# Patient Record
Sex: Female | Born: 2005 | Race: White | Hispanic: No | Smoking: Never smoker
Health system: Southern US, Community
[De-identification: ages and names within clinical notes are randomized; demographics above are authoritative.]

## PROBLEM LIST (undated history)

## (undated) DIAGNOSIS — R1110 Vomiting, unspecified: Secondary | ICD-10-CM

## (undated) DIAGNOSIS — Z9889 Other specified postprocedural states: Secondary | ICD-10-CM

## (undated) DIAGNOSIS — H539 Unspecified visual disturbance: Secondary | ICD-10-CM

## (undated) DIAGNOSIS — J45909 Unspecified asthma, uncomplicated: Secondary | ICD-10-CM

## (undated) DIAGNOSIS — F849 Pervasive developmental disorder, unspecified: Secondary | ICD-10-CM

## (undated) DIAGNOSIS — F909 Attention-deficit hyperactivity disorder, unspecified type: Secondary | ICD-10-CM

## (undated) DIAGNOSIS — R109 Unspecified abdominal pain: Secondary | ICD-10-CM

## (undated) DIAGNOSIS — R112 Nausea with vomiting, unspecified: Secondary | ICD-10-CM

## (undated) DIAGNOSIS — G90A Postural orthostatic tachycardia syndrome (POTS): Secondary | ICD-10-CM

## (undated) DIAGNOSIS — G901 Familial dysautonomia [Riley-Day]: Secondary | ICD-10-CM

## (undated) HISTORY — DX: Pervasive developmental disorder, unspecified: F84.9

## (undated) HISTORY — DX: Unspecified asthma, uncomplicated: J45.909

## (undated) HISTORY — DX: Unspecified visual disturbance: H53.9

## (undated) HISTORY — DX: Vomiting, unspecified: R11.10

## (undated) HISTORY — DX: Attention-deficit hyperactivity disorder, unspecified type: F90.9

## (undated) HISTORY — DX: Unspecified abdominal pain: R10.9

---
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
## 2023-01-03 ENCOUNTER — Ambulatory Visit (INDEPENDENT_AMBULATORY_CARE_PROVIDER_SITE_OTHER): Payer: Medicaid Other | Admitting: Pediatric Endocrinology

## 2023-01-03 ENCOUNTER — Encounter (INDEPENDENT_AMBULATORY_CARE_PROVIDER_SITE_OTHER): Payer: Self-pay | Admitting: Pediatric Endocrinology

## 2023-01-03 ENCOUNTER — Ambulatory Visit (INDEPENDENT_AMBULATORY_CARE_PROVIDER_SITE_OTHER): Payer: Medicaid Other | Admitting: Licensed Clinical Social Worker

## 2023-01-03 VITALS — BP 118/70 | HR 98 | Ht 64.41 in | Wt 167.6 lb

## 2023-01-03 DIAGNOSIS — F32A Depression, unspecified: Secondary | ICD-10-CM

## 2023-01-03 DIAGNOSIS — E349 Endocrine disorder, unspecified: Secondary | ICD-10-CM

## 2023-01-03 DIAGNOSIS — F419 Anxiety disorder, unspecified: Secondary | ICD-10-CM | POA: Diagnosis not present

## 2023-01-03 DIAGNOSIS — F649 Gender identity disorder, unspecified: Secondary | ICD-10-CM

## 2023-01-03 NOTE — BH Specialist Note (Signed)
Integrated Behavioral Health Warm Hand Off   MRN: [id number] Name: Daejah Klebba  Number of [place] Clinician visits: 0/6  Types of Service: Introduction only  Interpretor:No.    Warm Hand Off Completed.     Subjective: Samira Acero is a 17 y.o. adult accompanied by Self.  Patient was referred by Dr. Baldo Ash for gender related therapeutic services.  Patient reports the following symptoms/concerns: Patient reports a question about Gender Dysphoria diagnosis he reports is in his MyChart. Patient expresses the desire to have a letter for top surgery reporting the surgery will help improve his quality of life. Patient reports having other things going on that he would like to talk about further in therapy.    Objective: Mood: Euthymic and Affect: Appropriate  Clinician introduced herself and explained structure of [place], discussing number of sessions and billing. Patient expresses desire for therapeutic services. Scheduled initial visit for 01/13/23 at 3:30 pm    Plan: Follow up with behavioral health clinician on: 01/13/23 at 3:30 pm Referral(s): [place] (In Clinic)   Valda Favia, [place]

## 2023-01-03 NOTE — Progress Notes (Signed)
Pediatric Endocrinology Consultation Follow up Visit  Colleen Lopez 03/10/2006 [id number]  Preferred Name: Colleen Lopez Preferred Pronouns: he/him  HPI: Colleen Lopez is a 17 y.o. assigned female at birth presenting for follow up of gender affirming care. 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 He established care 03/23/2021, and received his first Lupron depot 30mg  injection 04/21/2021, and skipped August 2022 dose. He is accompanied to this visit by his grandmother who is his legal guardian. His grandfather lives in a separate household, and shares medical decision making though he does not attend medical appointments. There are multiple family members who do not know about Colleen Lopez.  2. Colleen Lopez was last seen in pediatric endocrinology on 09/06/22. In the interim he has been doing ok. He has had issues with his mom since he started transitioning with [facility].  He started testosterone in September 2023 after turning 30 years of age.   His mom is refusing to see him for who he is. She has been undermining his transition and his access to medical care. He is making plans to move to [place] to live with his GF. He says that her family is very accepting. His brothers are also accepting but they have not been wanting to take a stand with mom and create a rift in the family.   He has a lot of questions about timing of his top surgery and if Medicaid will continue to cover the surgery.   He also has questions about integrated behavioral health and questions about accutane.   He is taking 40 mg of testosterone every week. No issues with injection.     Body Goals: unchanged -flatter chest -deeper voice -more masculine body structure -more facial hair and other masculine hair growth  3. ROS: Greater than 10 systems reviewed with pertinent positives listed in HPI, otherwise neg.  Past Medical History:   Past Medical History:  Diagnosis Date   Abdominal pain    ADHD (attention deficit  hyperactivity disorder)    Asthma    Pervasive developmental disorder    Vision abnormalities    Vomiting   Initial history: When he was 27-58 years old he felt that he was "dysphoric" and fought with his gender identity.  He has always preferred masculine things.  He hated being called anything feminine or being forced to present as female.  He knows that he is not a tomboy. In 5-6th grade he discovered the term "transgender" and feels that best represents him.  Before high school and during the pandemic he "came out."  He socially transitioned 2 years ago.  He cut his hair and school system changed his name at age 61. He is binding sometimes.    He is seeing a psychiatrist, Dr. Diannia Ruder. This provider was reportedly uncomfortable with making the diagnosis of gender dysphoria.  He is also seeing a dermatologist for acne.  Meds: Outpatient Encounter Medications as of 01/03/2023  Medication Sig   albuterol (PROAIR HFA) 108 (90 Base) MCG/ACT inhaler Inhale 2 puffs into the lungs every 4 (four) hours as needed for wheezing or shortness of breath.   Azelastine HCl 0.15 % SOLN Place 2 sprays into both nostrils 2 (two) times daily.   fluticasone (FLONASE) 50 MCG/ACT nasal spray Place 2 sprays into both nostrils daily.   fluticasone (FLOVENT HFA) 110 MCG/ACT inhaler Inhale 2 puffs into the lungs 2 (two) times daily.   Insulin Syringe-Needle U-100 (INSULIN SYRINGE .5CC/28G) 28G X 1/2" 0.5 ML MISC Use as directed with  testosterone   Leuprolide Acetate, Ped,,6Mon, (FENSOLVI, 6 MONTH,) 45 MG KIT Inject 45 mg into the skin every 6 (six) months for 1 dose. [phone] (legal guardian's number)   Spacer/Aero-Holding Deretha Emory DEVI 1 applicator by Does not apply route as needed.   testosterone cypionate (DEPOTESTOSTERONE CYPIONATE) 200 MG/ML injection Inject 0.2 mLs (40 mg total) into the skin every 7 (seven) days.   Adapalene-Benzoyl Peroxide 0.3-2.5 % GEL Apply topically. (Patient not taking: Reported on  01/03/2023)   escitalopram (LEXAPRO) 20 MG tablet Take 1 tablet (20 mg total) by mouth daily. (Patient not taking: Reported on 01/03/2023)   hydrOXYzine (VISTARIL) 50 MG capsule Take 1 capsule (50 mg total) by mouth at bedtime. (Patient not taking: Reported on 01/03/2023)   Leuprolide Acetate, 3 Month, (LUPRON DEPOT-PED, 35-MONTH, IM) Inject into the muscle. (Patient not taking: Reported on 01/03/2023)   lidocaine-prilocaine (EMLA) cream Use as directed (Patient not taking: Reported on 01/03/2023)   methylphenidate (RITALIN) 10 MG tablet Take 1 tablet (10 mg total) by mouth 2 (two) times daily with breakfast and lunch. (Patient not taking: Reported on 01/03/2023)   mupirocin ointment (BACTROBAN) 2 % Apply 1 Application topically 2 (two) times daily. (Patient not taking: Reported on 01/03/2023)   norethindrone (MICRONOR) 0.35 MG tablet Take 1 tablet (0.35 mg total) by mouth daily. (Patient not taking: Reported on 01/03/2023)   Vitamin D, Ergocalciferol, (DRISDOL) 1.25 MG (50000 UNIT) CAPS capsule Take 1 capsule (50,000 Units total) by mouth every 7 (seven) days. (Patient not taking: Reported on 01/03/2023)   [DISCONTINUED] Leuprolide Acetate, Ped,,6Mon, (FENSOLVI, 6 MONTH,) 45 MG KIT Inject 45 mg into the skin every 6 (six) months for 1 dose.   [DISCONTINUED] Leuprolide Acetate, Ped,,6Mon, (FENSOLVI, 6 MONTH,) 45 MG KIT Inject 45 mg into the skin every 6 (six) months for 1 dose.   No facility-administered encounter medications on file as of 01/03/2023.    Allergies: No Known Allergies  Surgical History: Past Surgical History:  Procedure Laterality Date   septorhinoplasty  07/2020     Family History:  Family History  Problem Relation Age of Onset   Obesity Maternal Grandmother    Diabetes type II Maternal Grandmother    Hypertension Maternal Grandmother    COPD Maternal Grandmother    Hyperlipidemia Maternal Grandmother    Heart disease Maternal Grandmother    Aneurysm Maternal Grandfather     Early death Maternal Grandfather    Bipolar disorder Mother    Drug abuse Mother    Drug abuse Father    Alcohol abuse Father    ADD / ADHD Brother    Bipolar disorder Paternal Uncle    Migraines Neg Hx     Social History: Social History   Social History Narrative   ** Merged History Encounter **       Lives at home with grandmother and step grandfather, aunt and two half brothers. 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      Lives with [name] and 2 half brothers.    He is going to work towards getting GED   He enjoys playing bass, (Psychiatric nurse - guitar)  drawing, and fantasy world building.       Getting his GED. 23-24 school year  In a relationship with a trans-female. Non- penetrative intercourse  Physical Exam:  Vitals:   01/03/23 1001  BP: 118/70  Pulse: 98  Weight: 167 lb 9.6 oz (76 kg)  Height:  5' 4.41" (1.636 m)   BP 118/70 (BP Location: Left Arm, Patient Position: Sitting, Cuff Size: Large)   Pulse 98   Ht 5' 4.41" (1.636 m)   Wt 167 lb 9.6 oz (76 kg)   LMP 12/02/2022 (Approximate)   BMI 28.40 kg/m  Body mass index: body mass index is 28.4 kg/m. Blood pressure %iles are not available for patients who are 18 years or older.  Wt Readings from Last 3 Encounters:  01/03/23 167 lb 9.6 oz (76 kg) (92 %, Z= 1.42)*  12/22/22 166 lb 1.9 oz (75.4 kg) (92 %, Z= 1.39)*  11/08/22 172 lb 1.3 oz (78.1 kg) (94 %, Z= 1.52)*   * Growth percentiles are based on CDC (Girls, 2-20 Years) data.   Ht Readings from Last 3 Encounters:  01/03/23 5' 4.41" (1.636 m) (53 %, Z= 0.07)*  12/22/22 5\' 4"  (1.626 m) (46 %, Z= -0.09)*  11/08/22 5\' 5"  (1.651 m) (62 %, Z= 0.30)*   * Growth percentiles are based on CDC (Girls, 2-20 Years) data.   +9 pounds since last visit  Physical Exam Vitals reviewed.  Constitutional:      Appearance: Normal appearance.  HENT:     Head: Normocephalic and atraumatic.     Nose: Nose  normal.     Mouth/Throat:     Mouth: Mucous membranes are moist.  Eyes:     Extraocular Movements: Extraocular movements intact.     Comments: glasses  Cardiovascular:     Pulses: Normal pulses.  Pulmonary:     Effort: Pulmonary effort is normal. No respiratory distress.  Abdominal:     General: There is no distension.  Musculoskeletal:        General: Normal range of motion.     Cervical back: Normal range of motion and neck supple.  Skin:    General: Skin is warm.     Capillary Refill: Capillary refill takes less than 2 seconds.     Findings: No rash.  Neurological:     General: No focal deficit present.     Mental Status: He is alert.     Gait: Gait normal.  Psychiatric:        Mood and Affect: Mood normal.        Behavior: Behavior normal.      Labs: Results for orders placed or performed in visit on 12/22/22  Urine Culture   Specimen: Urine   UR  Result Value Ref Range   Urine Culture, Routine Final report    Organism ID, Bacteria Comment   Hemoglobin A1c  Result Value Ref Range   Hgb A1c MFr Bld 5.4 4.8 - 5.6 %   Est. average glucose Bld gHb Est-mCnc 108 mg/dL  VITAMIN D 25 Hydroxy (Vit-D Deficiency, Fractures)  Result Value Ref Range   Vit D, 25-Hydroxy 13.4 (L) 30.0 - 100.0 ng/mL  TSH + free T4  Result Value Ref Range   TSH 1.340 0.450 - 4.500 uIU/mL   Free T4 1.47 0.93 - 1.60 ng/dL  Lipid panel  Result Value Ref Range   Cholesterol, Total 163 100 - 169 mg/dL   Triglycerides 914 (H) 0 - 89 mg/dL   HDL 30 (L) >78 mg/dL   VLDL Cholesterol Cal 23 5 - 40 mg/dL   LDL Chol Calc (NIH) 295 (H) 0 - 109 mg/dL   Chol/HDL Ratio 5.4 (H) 0.0 - 4.4 ratio  CMP14+EGFR  Result Value Ref Range   Glucose 90 70 - 99 mg/dL   BUN  9 6 - 20 mg/dL   Creatinine, Ser 4.09 0.57 - 1.00 mg/dL   eGFR 94 >81 XB/JYN/8.29   BUN/Creatinine Ratio 10 9 - 23   Sodium 139 134 - 144 mmol/L   Potassium 4.4 3.5 - 5.2 mmol/L   Chloride 103 96 - 106 mmol/L   CO2 21 20 - 29 mmol/L    Calcium 10.0 8.7 - 10.2 mg/dL   Total Protein 7.5 6.0 - 8.5 g/dL   Albumin 4.9 4.0 - 5.0 g/dL   Globulin, Total 2.6 1.5 - 4.5 g/dL   Albumin/Globulin Ratio 1.9 1.2 - 2.2   Bilirubin Total 0.5 0.0 - 1.2 mg/dL   Alkaline Phosphatase 102 42 - 106 IU/L   AST 18 0 - 40 IU/L   ALT 19 0 - 32 IU/L  CBC with Differential/Platelet  Result Value Ref Range   WBC 6.5 3.4 - 10.8 x10E3/uL   RBC 5.27 3.77 - 5.28 x10E6/uL   Hemoglobin 15.3 11.1 - 15.9 g/dL   Hematocrit 56.2 13.0 - 46.6 %   MCV 86 79 - 97 fL   MCH 29.0 26.6 - 33.0 pg   MCHC 33.7 31.5 - 35.7 g/dL   RDW 86.5 78.4 - 69.6 %   Platelets 356 150 - 450 x10E3/uL   Neutrophils 52 Not Estab. %   Lymphs 40 Not Estab. %   Monocytes 6 Not Estab. %   Eos 1 Not Estab. %   Basos 1 Not Estab. %   Neutrophils Absolute 3.4 1.4 - 7.0 x10E3/uL   Lymphocytes Absolute 2.6 0.7 - 3.1 x10E3/uL   Monocytes Absolute 0.4 0.1 - 0.9 x10E3/uL   EOS (ABSOLUTE) 0.1 0.0 - 0.4 x10E3/uL   Basophils Absolute 0.1 0.0 - 0.2 x10E3/uL   Immature Granulocytes 0 Not Estab. %   Immature Grans (Abs) 0.0 0.0 - 0.1 x10E3/uL  Urinalysis, Routine w reflex microscopic  Result Value Ref Range   Specific Gravity, UA 1.023 1.005 - 1.030   pH, UA 5.5 5.0 - 7.5   Color, UA Yellow Yellow   Appearance Ur Clear Clear   Leukocytes,UA Negative Negative   Protein,UA Trace Negative/Trace   Glucose, UA Negative Negative   Ketones, UA Negative Negative   RBC, UA Negative Negative   Bilirubin, UA Negative Negative   Urobilinogen, Ur 0.2 0.2 - 1.0 mg/dL   Nitrite, UA Negative Negative   Microscopic Examination Comment   POCT URINALYSIS DIP (CLINITEK)  Result Value Ref Range   Color, UA yellow yellow   Clarity, UA clear clear   Glucose, UA negative negative mg/dL   Bilirubin, UA negative negative   Ketones, POC UA negative negative mg/dL   Spec Grav, UA 2.952 8.413 - 1.025   Blood, UA negative negative   pH, UA 5.5 5.0 - 8.0   POC PROTEIN,UA negative negative, trace    Urobilinogen, UA 0.2 0.2 or 1.0 E.U./dL   Nitrite, UA Negative Negative   Leukocytes, UA Negative Negative    Assessment/Plan: Darcus Toadvine is a 17 y.o. 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   He has been receiving GnRH agonist therapy with Boris Lown  He is interested in continuing on [facility] for now. He is anxious that with Norethindrone or Depo Provera he will have issues with dysregulated menstrual bleeding.   He has been on testosterone 40 mg q week since September 2023  Endocrine disorder related to puberty  - Continues on  Testosterone 40 mg weekly - Labs today - Questions about timing of surgery and options for care - Questions about alternatives to [facility] - Requesting referral to [facility] today. Referral placed.   Orders Placed This Encounter  Procedures   Luteinizing hormone   Testos,Total,Free and SHBG (Female)   Estradiol, Ultra Sens   Amb ref to Integrated Behavioral Health    Referral Priority:   Routine    Referral Type:   Consultation    Referral Reason:   Specialty Services Required    Referred to Provider:   Jill Side, LCSW    Number of Visits Requested:   1     Follow-up:   Return in about 3 months (around 04/04/2023).    Medical decision-making:   >40 minutes spent today reviewing the medical chart, counseling the patient/family, and documenting today's encounter.   Thank you for the opportunity to participate in the care of your patient. Please do not hesitate to contact me should you have any questions regarding the assessment or treatment plan.   Sincerely,   Dessa Phi, MD

## 2023-01-06 ENCOUNTER — Ambulatory Visit (INDEPENDENT_AMBULATORY_CARE_PROVIDER_SITE_OTHER): Payer: Medicaid Other | Admitting: Family Medicine

## 2023-01-06 ENCOUNTER — Encounter: Payer: Self-pay | Admitting: Family Medicine

## 2023-01-06 VITALS — BP 126/81 | HR 103 | Ht 64.0 in | Wt 167.0 lb

## 2023-01-06 DIAGNOSIS — Z789 Other specified health status: Secondary | ICD-10-CM | POA: Diagnosis not present

## 2023-01-06 DIAGNOSIS — R300 Dysuria: Secondary | ICD-10-CM | POA: Diagnosis not present

## 2023-01-06 DIAGNOSIS — Z23 Encounter for immunization: Secondary | ICD-10-CM | POA: Insufficient documentation

## 2023-01-06 MED ORDER — PHENAZOPYRIDINE HCL 200 MG PO TABS: 200.0000 mg | ORAL_TABLET | Freq: Three times a day (TID) | ORAL | 0 refills | Status: AC | PRN

## 2023-01-06 NOTE — Patient Instructions (Signed)
I appreciate the opportunity to provide care to you today!    Follow up:  4 months    Please continue to a heart-healthy diet and increase your physical activities. Try to exercise for 44mins at least five times a week.   Refer:  [facility] for talk therapy   It was a pleasure to see you and I look forward to continuing to work together on your health and well-being. Please do not hesitate to call the office if you need care or have questions about your care.   Have a wonderful day and week. With Gratitude, Alvira Monday MSN, FNP-BC
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
## 2023-01-08 LAB — TESTOS,TOTAL,FREE AND SHBG (FEMALE)
Free Testosterone: 78.3 pg/mL — ABNORMAL HIGH (ref 0.1–6.4)
Sex Hormone Binding: 14.4 nmol/L — ABNORMAL LOW (ref 17–124)
Testosterone, Total, LC-MS-MS: 385 ng/dL — ABNORMAL HIGH (ref 2–45)

## 2023-01-08 LAB — LUTEINIZING HORMONE: LH: <0.2 m[IU]/mL — ABNORMAL LOW

## 2023-01-08 LAB — ESTRADIOL, ULTRA SENS: Estradiol, Ultra Sensitive: 33 pg/mL
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
## 2023-01-13 ENCOUNTER — Telehealth (INDEPENDENT_AMBULATORY_CARE_PROVIDER_SITE_OTHER): Payer: Medicaid Other | Admitting: Licensed Clinical Social Worker

## 2023-01-13 DIAGNOSIS — F419 Anxiety disorder, unspecified: Secondary | ICD-10-CM | POA: Diagnosis not present

## 2023-01-13 DIAGNOSIS — F411 Generalized anxiety disorder: Secondary | ICD-10-CM

## 2023-01-13 DIAGNOSIS — F332 Major depressive disorder, recurrent severe without psychotic features: Secondary | ICD-10-CM | POA: Diagnosis not present

## 2023-01-14 ENCOUNTER — Encounter: Payer: Self-pay | Admitting: Obstetrics & Gynecology

## 2023-01-16 ENCOUNTER — Encounter (INDEPENDENT_AMBULATORY_CARE_PROVIDER_SITE_OTHER): Payer: Self-pay

## 2023-01-16 ENCOUNTER — Other Ambulatory Visit (INDEPENDENT_AMBULATORY_CARE_PROVIDER_SITE_OTHER): Payer: Self-pay

## 2023-01-16 NOTE — Addendum Note (Signed)
Addended by: Valda Favia on: 01/16/2023 12:22 PM   Modules accepted: Orders

## 2023-01-16 NOTE — Telephone Encounter (Signed)
Called and lvm for pt to call me.

## 2023-01-16 NOTE — Addendum Note (Signed)
Addended by: Valda Favia on: 01/16/2023 01:29 PM   Modules accepted: Orders

## 2023-01-16 NOTE — Telephone Encounter (Signed)
I believe you are correct. Please double check with family.   Dr. Baldo Ash

## 2023-01-19 ENCOUNTER — Telehealth (INDEPENDENT_AMBULATORY_CARE_PROVIDER_SITE_OTHER): Payer: Self-pay | Admitting: Pediatric Endocrinology

## 2023-01-19 NOTE — Telephone Encounter (Signed)
  Name of who is calling: Aleena from [facility] contact number: [phone]  Provider they see: Dr. Baldo Ash  Reason for call: Billie Ruddy from [facility] left a voicemail stating there was a RX sent in for fensolvi 45 mg kit. She is asking for clarification if the pt is on Fensolvi or Lupron.

## 2023-01-20 ENCOUNTER — Ambulatory Visit (INDEPENDENT_AMBULATORY_CARE_PROVIDER_SITE_OTHER): Payer: Medicaid Other | Admitting: Licensed Clinical Social Worker

## 2023-01-20 DIAGNOSIS — F419 Anxiety disorder, unspecified: Secondary | ICD-10-CM

## 2023-01-20 DIAGNOSIS — F411 Generalized anxiety disorder: Secondary | ICD-10-CM

## 2023-01-20 DIAGNOSIS — F32A Depression, unspecified: Secondary | ICD-10-CM | POA: Diagnosis not present

## 2023-01-20 DIAGNOSIS — F642 Gender identity disorder of childhood: Secondary | ICD-10-CM | POA: Diagnosis not present

## 2023-01-20 DIAGNOSIS — F332 Major depressive disorder, recurrent severe without psychotic features: Secondary | ICD-10-CM
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
## 2023-01-24 NOTE — Telephone Encounter (Signed)
Fax was also received asking about this same issue. Fax was filled out stating that "pt will be holding therapy for now while he considers how he wants to move forward." And had provider sign it and faxed to [employer].

## 2023-01-26 NOTE — BH Specialist Note (Deleted)
Integrated Behavioral Health Follow Up In-Person Visit  MRN: [id number] Name: Prescott Lopez  Preferred Name: Colleen Bidding Preferred Pronouns: He/Him/His  Number of [facility] Clinician visits: Follow up - (2/6)  Session Start time:  Session End time:  Total time in minutes:   Types of Service: {CHL AMB TYPE OF [phone]}  Interpretor:No.    Aolanis Crispen is a 17 y.o. adult accompanied by  self  Patient was referred by Dr. Baldo Ash for Gender Dysphoria and anxiety.   Subjective: ***  Duration of initial problem: Patient reports understanding symptoms of anxiety or depression beginning at age 1 or 35; Severity of problem: severe   Objective: Mood: {BHH MOOD:22306} and Affect: {BHH AFFECT:22307} Risk of harm to self or others: {CHL AMB BH Suicide Current Mental Status:21022748}  Patient and/or Family's Strengths/Protective Factors: {CHL AMB BH PROTECTIVE [phone]}  Goals Addressed: Patient will:  Reduce symptoms of: {IBH Symptoms:21014056}   Increase knowledge and/or ability of: {IBH Patient Tools:21014057}   Demonstrate ability to: {IBH Goals:21014053}  Progress towards Goals: {CHL AMB BH PROGRESS TOWARDS [phone]}  Interventions: Interventions utilized:  {IBH Interventions:21014054} Standardized Assessments completed: {IBH Screening Tools:21014051}  Patient and/or Family Response: ***  Patient Centered Plan: Patient is on the following Treatment Plan(s): ***   Plan: Follow up with behavioral health clinician on : *** Behavioral recommendations: *** Referral(s): {IBH Referrals:21014055} "From scale of 1-10, how likely are you to follow plan?": ***  Valda Favia, LCSW

## 2023-01-27 ENCOUNTER — Encounter (INDEPENDENT_AMBULATORY_CARE_PROVIDER_SITE_OTHER): Payer: Self-pay

## 2023-01-27 ENCOUNTER — Ambulatory Visit (INDEPENDENT_AMBULATORY_CARE_PROVIDER_SITE_OTHER): Payer: Self-pay | Admitting: Licensed Clinical Social Worker

## 2023-01-30 ENCOUNTER — Ambulatory Visit (INDEPENDENT_AMBULATORY_CARE_PROVIDER_SITE_OTHER): Payer: Medicaid Other | Admitting: Obstetrics & Gynecology

## 2023-01-30 ENCOUNTER — Encounter: Payer: Self-pay | Admitting: Obstetrics & Gynecology

## 2023-01-30 VITALS — BP 138/85 | HR 101 | Ht 64.0 in | Wt 163.0 lb

## 2023-01-30 DIAGNOSIS — Z3009 Encounter for other general counseling and advice on contraception: Secondary | ICD-10-CM

## 2023-01-30 DIAGNOSIS — Z3042 Encounter for surveillance of injectable contraceptive: Secondary | ICD-10-CM | POA: Diagnosis not present

## 2023-01-30 DIAGNOSIS — N898 Other specified noninflammatory disorders of vagina: Secondary | ICD-10-CM

## 2023-01-30 DIAGNOSIS — Z789 Other specified health status: Secondary | ICD-10-CM

## 2023-01-30 DIAGNOSIS — Z308 Encounter for other contraceptive management: Secondary | ICD-10-CM

## 2023-01-30 LAB — POCT URINE PREGNANCY: Preg Test, Ur: NEGATIVE

## 2023-01-30 MED ORDER — MEDROXYPROGESTERONE ACETATE 150 MG/ML IM SUSP: 150.0000 mg | INTRAMUSCULAR | 4 refills | Status: DC

## 2023-01-30 MED ORDER — MEDROXYPROGESTERONE ACETATE 150 MG/ML IM SUSP
150.0000 mg | Freq: Once | INTRAMUSCULAR | Status: AC
  Administered 2023-01-30: 150 mg via INTRAMUSCULAR

## 2023-01-30 NOTE — Progress Notes (Signed)
GYN VISIT Patient name: Colleen Lopez MRN [id number]  Date of birth: 2006-03-25 Chief Complaint:   Discuss Depot  History of Present Illness:   Colleen Lopez is a 17 y.o. transgender female being seen today for the following concerns:  -Contraception: Plans to start on accutane and desires contraception.  Most interested in Depot.  Additionally, testosterone is making periods irregular and very heard to track.  Previously periods were regular each month- noted change in period with starting testeosterone.  -Vaginal dryness: Notes moderate to severe vaginal dryness. Denies self-stimulation or sexual activity rather dryness from day to day and clitoromegaly.  Concerns about future reproductive health- may consider a child in the future.  Concerned about the long term use of testosterone and family planning.  Interested in learning about ovarian preservation/egg freezing.  Patient's last menstrual period was 12/02/2022 (approximate).     01/30/2023    2:31 PM 01/13/2023    3:56 PM 01/06/2023   10:06 AM 12/22/2022   10:35 AM 11/08/2022    1:10 PM  Depression screen PHQ 2/9  Decreased Interest 3 3 1  0 1  Down, Depressed, Hopeless 3 2 1  0 1  PHQ - 2 Score 6 5 2  0 2  Altered sleeping 3 3 1  0 1  Tired, decreased energy 3 3 1  0 0  Change in appetite 3 3 0 0 1  Feeling bad or failure about yourself  3 3 3  0 3  Trouble concentrating  3 3 0 0  Moving slowly or fidgety/restless 3 3 0 0 3  Suicidal thoughts 3  0 0 0  PHQ-9 Score 24 23 10  0 10  Difficult doing work/chores   Somewhat difficult Not difficult at all Very difficult     Review of Systems:   Pertinent items are noted in HPI Denies fever/chills, dizziness, headaches, visual disturbances, fatigue, shortness of breath, chest pain, abdominal pain, vomiting. Pertinent History Reviewed:  Reviewed past medical,surgical, social, obstetrical and family history.  Reviewed problem list, medications and allergies. Physical Assessment:    Vitals:   01/30/23 1429 01/30/23 1516  BP: (!) 141/87 138/85  Pulse: 90 (!) 101  Weight: 163 lb (73.9 kg)   Height: 5\' 4"  (1.626 m)   Body mass index is 27.98 kg/m.       Physical Examination:   General appearance: alert, well appearing, and in no distress  Psych: mood appropriate, normal affect  Skin: warm & dry   Cardiovascular: normal heart rate noted  Respiratory: normal respiratory effort, no distress  Abdomen: soft, non-tender   Pelvic: examination not indicated  Extremities: no edema   Chaperone: N/A    Assessment & Plan:  1) Contraception -reviewed long term options including Nexplanon, IUD and/or Depot -if IUD desired will plan with pre-medication including Cytotec, ibuprofen and possibly xanax or valium -for now Depot q 3mos  2) Vaginal dryness -recommended daily personal moisturizer/lubricant -if no improvement, pt given premarin sample -reviewed pros/cons of vaginal estrogen- if notes improvement, will plan to send in Rx []  f/u in 2-3 mos to monitor symptoms  3) Family planning -as pt was well aware limited studies on long term impact of testosterone -reviewed potential for infertility preservation and advised f/u with [facility] as they are likely to have further information including cost  Meds ordered this encounter  Medications   medroxyPROGESTERone (DEPO-PROVERA) injection 150 mg   medroxyPROGESTERone (DEPO-PROVERA) 150 MG/ML injection    Sig: Inject 1 mL (150 mg total) into the muscle every 3 (three)  months.    Dispense:  1 mL    Refill:  4     Orders Placed This Encounter  Procedures   POCT urine pregnancy    Return in about 3 months (around 04/30/2023) for Depot every 3 mos, 3 mos med follow (ok for video visit).   Myna Hidalgo, DO Attending Obstetrician & Gynecologist, [facility] for [employer], [facility]

## 2023-02-08 ENCOUNTER — Telehealth: Payer: Self-pay

## 2023-02-08 NOTE — Transitions of Care (Post Inpatient/ED Visit) (Unsigned)
02/08/2023  Name: Colleen Lopez MRN: [id number] DOB: 10/31/2006  Today's TOC FU Call Status: Today's TOC FU Call Status:: Unsuccessul Call (1st Attempt) Unsuccessful Call (1st Attempt) Date: 02/08/23  Attempted to reach the patient regarding the most recent Inpatient/ED visit.  Follow Up Plan: Additional outreach attempts will be made to reach the patient to complete the Transitions of Care (Post Inpatient/ED visit) call.   Signature  Woodfin Ganja LPN [facility] Nurse Health Advisor Direct Dial [phone]

## 2023-02-08 NOTE — Transitions of Care (Post Inpatient/ED Visit) (Unsigned)
02/08/2023  Name: Colleen Lopez MRN: [id number] DOB: 11/27/2006  Today's TOC FU Call Status: Today's TOC FU Call Status:: Unsuccessul Call (1st Attempt) Unsuccessful Call (1st Attempt) Date: 02/08/23  Attempted to reach the patient regarding the most recent Inpatient/ED visit.  Follow Up Plan: Additional outreach attempts will be made to reach the patient to complete the Transitions of Care (Post Inpatient/ED visit) call.   Signature Karena Addison, LPN [facility] Nurse Health Advisor Direct Dial [phone]

## 2023-02-09 NOTE — Transitions of Care (Post Inpatient/ED Visit) (Signed)
02/09/2023  Name: Colleen Lopez MRN: [id number] DOB: 22-Apr-2006  Today's TOC FU Call Status: Today's TOC FU Call Status:: Unsuccessful Call (2nd Attempt) Unsuccessful Call (1st Attempt) Date: 02/08/23 Unsuccessful Call (2nd Attempt) Date: 02/09/23  Attempted to reach the patient regarding the most recent Inpatient/ED visit.  Follow Up Plan: No further outreach attempts will be made at this time. We have been unable to contact the patient.  Signature  Woodfin Ganja LPN [facility] Nurse Health Advisor Direct Dial [phone]

## 2023-02-09 NOTE — Transitions of Care (Post Inpatient/ED Visit) (Signed)
02/09/2023  Name: Colleen Lopez MRN: [id number] DOB: 09-15-2006  Today's TOC FU Call Status: Today's TOC FU Call Status:: Unsuccessful Call (2nd Attempt) Unsuccessful Call (1st Attempt) Date: 02/08/23 Unsuccessful Call (2nd Attempt) Date: 02/09/23  Attempted to reach the patient regarding the most recent Inpatient/ED visit.  Follow Up Plan: No further outreach attempts will be made at this time. We have been unable to contact the patient.  Signature Karena Addison, LPN [facility] Nurse Health Advisor Direct Dial [phone]

## 2023-02-14 ENCOUNTER — Encounter (INDEPENDENT_AMBULATORY_CARE_PROVIDER_SITE_OTHER): Payer: Self-pay | Admitting: Pediatric Endocrinology

## 2023-02-15 ENCOUNTER — Other Ambulatory Visit (INDEPENDENT_AMBULATORY_CARE_PROVIDER_SITE_OTHER): Payer: Self-pay | Admitting: Pediatric Endocrinology

## 2023-02-15 NOTE — Telephone Encounter (Signed)
Why does he need a refill so soon?

## 2023-02-17 ENCOUNTER — Encounter: Payer: Self-pay | Admitting: Family Medicine

## 2023-02-17 ENCOUNTER — Ambulatory Visit: Payer: Medicaid Other | Admitting: Family Medicine

## 2023-03-02 ENCOUNTER — Telehealth (HOSPITAL_COMMUNITY): Payer: Medicaid Other | Admitting: Psychiatry

## 2023-03-07 ENCOUNTER — Other Ambulatory Visit: Payer: Self-pay | Admitting: *Deleted

## 2023-03-08 ENCOUNTER — Encounter (HOSPITAL_COMMUNITY): Payer: Self-pay | Admitting: Psychiatry

## 2023-03-08 ENCOUNTER — Ambulatory Visit (INDEPENDENT_AMBULATORY_CARE_PROVIDER_SITE_OTHER): Payer: No Typology Code available for payment source | Admitting: Psychiatry

## 2023-03-08 ENCOUNTER — Other Ambulatory Visit: Payer: Self-pay | Admitting: Allergy & Immunology

## 2023-03-08 VITALS — BP 120/77 | HR 97 | Ht 65.0 in | Wt 165.6 lb

## 2023-03-08 DIAGNOSIS — F902 Attention-deficit hyperactivity disorder, combined type: Secondary | ICD-10-CM | POA: Diagnosis not present

## 2023-03-08 DIAGNOSIS — F849 Pervasive developmental disorder, unspecified: Secondary | ICD-10-CM | POA: Diagnosis not present

## 2023-03-08 DIAGNOSIS — F642 Gender identity disorder of childhood: Secondary | ICD-10-CM

## 2023-03-08 MED ORDER — METHYLPHENIDATE HCL 10 MG PO TABS: 10.0000 mg | ORAL_TABLET | Freq: Two times a day (BID) | ORAL | 0 refills | Status: DC

## 2023-03-08 MED ORDER — ESCITALOPRAM OXALATE 10 MG PO TABS: 10.0000 mg | ORAL_TABLET | Freq: Every day | ORAL | 2 refills | Status: DC

## 2023-03-08 NOTE — Progress Notes (Signed)
BH MD/PA/NP OP Progress Note  03/08/2023 2:31 PM Colleen Lopez  MRN:  [id number]  Chief Complaint:  Chief Complaint  Patient presents with   Depression   Anxiety   Follow-up   HPI: This patient is an 17 year old transgender female.  He is now living with his father in [place].  He is working at a Probation officer and is also working on test to get his GED.  The patient returns for follow-up after long absence.  He was last seen about 8 months ago.  He states about 2 months ago he moved out because the environment with his grandmother and brothers was "toxic."  He states that his grandmother was always getting him and his family was not supportive of his gender transition.  About 2 months ago he was moved in with his father and he feels much more comfortable there.  He is proceeding along and his gender transition.  He is still on hormone treatment.  His voice is lowered considerably.  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  He is going to be taking tests to get his GED.  He has consulted a surgeon to have his breast reduced and needs me to write a letter stating that he is competent to make this decision and I think this is reasonable.  He denies serious depression and states that he wants to decrease his Lexapro because of side effects such as fatigue.  He takes the Ritalin sporadically when he needs it.  He is not taking any other psychiatric medications.  He denies significant depression anxiety thoughts of self-harm or suicide.  Of note he was in the emergency room at [facility] last month after he cut himself.  He states he has been talking to his therapist about triggering events and then got in a big argument with his grandmother which led to the superficial cutting.  He had been clean from cutting for 7 months and has not done it since.  He was released from the ED with follow-up to myself and his therapist. Visit Diagnosis:    ICD-10-CM   1. Pervasive  developmental disorder  F84.9     2. Attention deficit hyperactivity disorder (ADHD), combined type  F90.2     3. 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  F70.2       Past Psychiatric History: none  Past Medical History:  Past Medical History:  Diagnosis Date   Abdominal pain    ADHD (attention deficit hyperactivity disorder)    Asthma    Pervasive developmental disorder    Vision abnormalities    Vomiting     Past Surgical History:  Procedure Laterality Date   septorhinoplasty  07/2020    Family Psychiatric History: See below  Family History:  Family History  Problem Relation Age of Onset   Obesity Maternal Grandmother    Diabetes type II Maternal Grandmother    Hypertension Maternal Grandmother    COPD Maternal Grandmother    Hyperlipidemia Maternal Grandmother    Heart disease Maternal Grandmother    Aneurysm Maternal Grandfather    Early death Maternal Grandfather    Bipolar disorder Mother    Drug abuse Mother    Drug abuse Father    Alcohol abuse Father    ADD / ADHD Brother    Bipolar disorder Paternal Uncle    Migraines Neg Hx     Social History:  Social History   Socioeconomic History   Marital  status: Significant Other    Spouse name: Not on file   Number of children: Not on file   Years of education: Not on file   Highest education level: Not on file  Occupational History   Not on file  Tobacco Use   Smoking status: Never    Passive exposure: Yes   Smokeless tobacco: Never  Substance and Sexual Activity   Alcohol use: No   Drug use: No   Sexual activity: Never  Other Topics Concern   Not on file  Social History Narrative   ** Merged History Encounter **       Lives at home with grandmother and step grandfather, aunt and two half brothers. 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      Lives with Royann Shivers and 2 half brothers.    He is going to work towards getting GED   He  enjoys playing bass, (Psychologist, educational - guitar)  drawing, and fantasy world building.       Getting his GED. 23-24 school year   Social Determinants of Health   Financial Resource Strain: Not on file  Food Insecurity: Not on file  Transportation Needs: Not on file  Physical Activity: Not on file  Stress: Not on file  Social Connections: Not on file    Allergies: No Known Allergies  Metabolic Disorder Labs: Lab Results  Component Value Date   HGBA1C 5.4 12/22/2022   Lab Results  Component Value Date   PROLACTIN 8.0 07/13/2022   Lab Results  Component Value Date   CHOL 163 12/22/2022   TRIG 125 (H) 12/22/2022   HDL 30 (L) 12/22/2022   CHOLHDL 5.4 (H) 12/22/2022   LDLCALC 110 (H) 12/22/2022   LDLCALC 56 03/23/2021   Lab Results  Component Value Date   TSH 1.340 12/22/2022   TSH 2.28 03/23/2021    Therapeutic Level Labs: No results found for: "LITHIUM" No results found for: "VALPROATE" No results found for: "CBMZ"  Current Medications: Current Outpatient Medications  Medication Sig Dispense Refill   Adapalene-Benzoyl Peroxide 0.3-2.5 % GEL Apply topically.     albuterol (PROAIR HFA) 108 (90 Base) MCG/ACT inhaler Inhale 2 puffs into the lungs every 4 (four) hours as needed for wheezing or shortness of breath. 1 each 3   Azelastine HCl 0.15 % SOLN Place 2 sprays into both nostrils 2 (two) times daily. 30 mL 5   escitalopram (LEXAPRO) 10 MG tablet Take 1 tablet (10 mg total) by mouth daily. 30 tablet 2   fluticasone (FLONASE) 50 MCG/ACT nasal spray Place 2 sprays into both nostrils daily. 16 g 5   fluticasone (FLOVENT HFA) 110 MCG/ACT inhaler Inhale 2 puffs into the lungs 2 (two) times daily. 1 each 5   Insulin Syringe-Needle U-100 (INSULIN SYRINGE .5CC/28G) 28G X 1/2" 0.5 ML MISC Use as directed with testosterone 10 each 5   Leuprolide Acetate, 3 Month, (LUPRON DEPOT-PED, 98-MONTH, IM) Inject into the muscle.     Leuprolide Acetate, Ped,,6Mon, (FENSOLVI, 6 MONTH,) 45 MG  KIT Inject 45 mg into the skin every 6 (six) months for 1 dose. [phone] (legal guardian's number) 1 kit 1   lidocaine-prilocaine (EMLA) cream Use as directed 30 g 0   medroxyPROGESTERone (DEPO-PROVERA) 150 MG/ML injection Inject 1 mL (150 mg total) into the muscle every 3 (three) months. 1 mL 4   mupirocin ointment (BACTROBAN) 2 % Apply 1 Application topically 2 (two) times  daily. 22 g 0   Spacer/Aero-Holding Chambers DEVI 1 applicator by Does not apply route as needed. 1 application 0   testosterone cypionate (DEPOTESTOSTERONE CYPIONATE) 200 MG/ML injection INJECT 0.2 MLS (40MG  TOTAL) INTO THE SKIN EVERY 7 DAYS. 4 mL 0   Vitamin D, Ergocalciferol, (DRISDOL) 1.25 MG (50000 UNIT) CAPS capsule Take 1 capsule (50,000 Units total) by mouth every 7 (seven) days. 10 capsule 2   methylphenidate (RITALIN) 10 MG tablet Take 1 tablet (10 mg total) by mouth 2 (two) times daily with breakfast and lunch. 60 tablet 0   No current facility-administered medications for this visit.     Musculoskeletal: Strength & Muscle Tone: within normal limits Gait & Station: normal Patient leans: N/A  Psychiatric Specialty Exam: Review of Systems  Psychiatric/Behavioral:  Positive for decreased concentration.   All other systems reviewed and are negative.   Blood pressure 120/77, pulse 97, height 5\' 5"  (1.651 m), weight 165 lb 9.6 oz (75.1 kg), SpO2 97 %.Body mass index is 27.56 kg/m.  General Appearance: Casual and Fairly Groomed  Eye Contact:  Good  Speech:  Clear and Coherent  Volume:  Normal  Mood:  Euthymic  Affect:  Congruent  Thought Process:  Goal Directed  Orientation:  Full (Time, Place, and Person)  Thought Content: WDL   Suicidal Thoughts:  No  Homicidal Thoughts:  No  Memory:  Immediate;   Good Recent;   Good Remote;   Good  Judgement:  Good  Insight:  Fair  Psychomotor Activity:  Normal  Concentration:  Concentration: Fair and Attention Span: Fair  Recall:  Good  Fund of Knowledge:  Good  Language: Good  Akathisia:  No  Handed:  Right  AIMS (if indicated): not done  Assets:  Communication Skills Desire for Improvement Physical Health Resilience Social Support Talents/Skills  ADL's:  Intact  Cognition: WNL  Sleep:  Good   Screenings: GAD-7    Flowsheet Row Office Visit from 03/08/2023 in [facility] [place] Visit from 01/30/2023 in [facility] [place] at [facility] Video Visit from 01/13/2023 in [facility] Office Visit from 01/06/2023 in [facility] Office Visit from 12/22/2022 in [facility]  Total GAD-7 Score 2 21 21 10 6       PHQ2-9    [place] Office Visit from 03/08/2023 in [place] [facility] [place] from 01/30/2023 in [facility] [place] [facility] [place] [facility] Video Visit from 01/13/2023 in [facility] Office Visit from 01/06/2023 in [facility] Office Visit from 12/22/2022 in [place] [facility]  PHQ-2 Total Score 0 6 5 2  0  PHQ-9 Total Score 4 24 23 10  0      [place] Office Visit from 03/08/2023 in [place] at [place] Video Visit from 07/14/2022 in [place] at [place] Visit from 03/24/2022 in [place] at [place] No Risk No Risk No Risk        Assessment and Plan: This patient is an 17 year old transgender female with a history of gender dysphoria autism spectrum disorder depression social anxiety ADD.  He is really not taking much in terms of psychiatric medicine but is in agreement with continuing Lexapro but at a lower dose-10 mg daily.  He will also continue methylphenidate 10  mg twice daily as needed.  He will return to see me in 2  months  Collaboration of Care: Collaboration of Care: Other provider involved in patient's care AEB notes are shared with endocrinology on the epic system  Patient/Guardian was advised Release of Information must be obtained prior to any record release in order to collaborate their care with an outside provider. Patient/Guardian was advised if they have not already done so to contact the registration department to sign all necessary forms in order for [name] to release information regarding their care.   Consent: Patient/Guardian gives verbal consent for treatment and assignment of benefits for services provided during this visit. Patient/Guardian expressed understanding and agreed to proceed.    Levonne Spiller, MD 03/08/2023, 2:31 PM

## 2023-03-09 ENCOUNTER — Encounter (HOSPITAL_COMMUNITY): Payer: Self-pay | Admitting: Psychiatry

## 2023-03-13 ENCOUNTER — Ambulatory Visit: Payer: Medicaid Other | Admitting: Family Medicine

## 2023-03-13 ENCOUNTER — Ambulatory Visit (INDEPENDENT_AMBULATORY_CARE_PROVIDER_SITE_OTHER): Payer: Medicaid Other | Admitting: Podiatry

## 2023-03-13 DIAGNOSIS — L600 Ingrowing nail: Secondary | ICD-10-CM | POA: Diagnosis not present

## 2023-03-13 MED ORDER — HYDROCODONE-ACETAMINOPHEN 5-325 MG PO TABS: 1.0000 | ORAL_TABLET | Freq: Four times a day (QID) | ORAL | 0 refills | Status: DC | PRN

## 2023-03-13 NOTE — Addendum Note (Signed)
Addended by: Edrick Kins on: 03/13/2023 05:57 PM   Modules accepted: Orders

## 2023-03-13 NOTE — Progress Notes (Signed)
   Chief Complaint  Patient presents with   Ingrown Toenail    Bilateral ingrown toenails, started 3 months ago, patient is having some drainage on the right hallux,     Subjective: Patient presents today regarding a new complaint of pain to the medial border of the bilateral great toes as well as the lateral border of the right second toe. Patient is concerned for possible ingrown nail.  It is very sensitive to touch.  Patient has a history of partial nail matricectomy to the lateral border of the left great toe with good results.  Patient presents today for further treatment and evaluation.  Past Medical History:  Diagnosis Date   Abdominal pain    ADHD (attention deficit hyperactivity disorder)    Asthma    Pervasive developmental disorder    Vision abnormalities    Vomiting    Past Surgical History:  Procedure Laterality Date   septorhinoplasty  07/2020   No Known Allergies  Objective:  General: Well developed, nourished, in no acute distress, alert and oriented x3   Dermatology: Skin is warm, dry and supple bilateral.  Medial border bilateral great toes as well as the lateral border right great toe is tender with evidence of an ingrowing nail. Pain on palpation noted to the border of the nail fold. The remaining nails appear unremarkable at this time. There are no open sores, lesions.  Vascular: DP and PT pulses palpable.  No clinical evidence of vascular compromise  Neruologic: Grossly intact via light touch bilateral.  Musculoskeletal: No pedal deformity noted  Assesement: #1 Paronychia with ingrowing nail great toes B/L medial, RT lateral #2 history of partial nail matricectomy lateral border left great toe  Plan of Care:  1. Patient evaluated.  2. Discussed treatment alternatives and plan of care. Explained nail avulsion procedure and post procedure course to patient. 3. Patient opted for permanent partial nail avulsion of the ingrown portion of the nail.  4.  Prior to procedure, local anesthesia infiltration utilized using 3 ml of a 50:50 mixture of 2% plain lidocaine and 0.5% plain marcaine in a normal hallux block fashion and a betadine prep performed.  5. Partial permanent nail avulsion with chemical matrixectomy performed using [id number] applications of phenol followed by alcohol flush.  6. Light dressing applied.  Post care instructions provided 7.  Prescription for Vicodin 5/325 mg every 4 hours as needed pain #20  8.  Return to clinic 3 weeks  Edrick Kins, DPM [facility]  Dr. Edrick Kins, DPM    [address], [place] [address]                Office [phone]  Fax [phone]

## 2023-03-13 NOTE — Patient Instructions (Signed)

## 2023-03-14 ENCOUNTER — Telehealth (INDEPENDENT_AMBULATORY_CARE_PROVIDER_SITE_OTHER): Payer: Medicaid Other | Admitting: Family Medicine

## 2023-03-14 DIAGNOSIS — N390 Urinary tract infection, site not specified: Secondary | ICD-10-CM

## 2023-03-14 DIAGNOSIS — N3090 Cystitis, unspecified without hematuria: Secondary | ICD-10-CM | POA: Diagnosis not present

## 2023-03-14 LAB — POCT URINALYSIS DIP (CLINITEK)
Bilirubin, UA: NEGATIVE
Blood, UA: NEGATIVE
Glucose, UA: NEGATIVE mg/dL
Ketones, POC UA: NEGATIVE mg/dL
Leukocytes, UA: NEGATIVE
Nitrite, UA: NEGATIVE
POC PROTEIN,UA: NEGATIVE
Spec Grav, UA: 1.025 (ref 1.010–1.025)
Urobilinogen, UA: 0.2 E.U./dL
pH, UA: 6.5 (ref 5.0–8.0)

## 2023-03-14 MED ORDER — PHENAZOPYRIDINE HCL 100 MG PO TABS: 100.0000 mg | ORAL_TABLET | Freq: Three times a day (TID) | ORAL | 0 refills | Status: AC | PRN

## 2023-03-14 NOTE — Progress Notes (Signed)
Kindly inform the patient that his UA was negative for UTI. Pyridum is sent to his pharmacy

## 2023-03-14 NOTE — Progress Notes (Signed)
Virtual Visit via Video Note  I connected with Colleen Lopez on 03/14/23 at  2:00 PM EDT by a video enabled telemedicine application and verified that I am speaking with the correct person using two identifiers.  Patient Location: Home Provider Location: Home Office  I discussed the limitations, risks, security, and privacy concerns of performing an evaluation and management service by video and the availability of in person appointments. I also discussed with the patient that there may be a patient responsible charge related to this service. The patient expressed understanding and agreed to proceed.  Subjective: PCP: Alvira Monday, FNP  No chief complaint on file.  HPI The patient is seen today with complaints of urinary symptoms.  He reports having symptoms of urgency, frequency, and dysuria.  Symptoms have been lingering off and on since November 2023.  He would like a referral to urology for further evaluation of his frequent UTI.  He reports adequate fluid intake and adherence to preventative measures to prevent UTIs.  No fever or chills reported.     ROS: Per HPI  Current Outpatient Medications:    phenazopyridine (PYRIDIUM) 100 MG tablet, Take 1 tablet (100 mg total) by mouth 3 (three) times daily as needed for up to 2 days for pain., Disp: 6 tablet, Rfl: 0   Adapalene-Benzoyl Peroxide 0.3-2.5 % GEL, Apply topically., Disp: , Rfl:    albuterol (PROAIR HFA) 108 (90 Base) MCG/ACT inhaler, Inhale 2 puffs into the lungs every 4 (four) hours as needed for wheezing or shortness of breath., Disp: 1 each, Rfl: 3   Azelastine HCl 0.15 % SOLN, Place 2 sprays into both nostrils 2 (two) times daily., Disp: 30 mL, Rfl: 5   escitalopram (LEXAPRO) 10 MG tablet, Take 1 tablet (10 mg total) by mouth daily., Disp: 30 tablet, Rfl: 2   fluticasone (FLONASE) 50 MCG/ACT nasal spray, Place 2 sprays into both nostrils daily., Disp: 16 g, Rfl: 5   fluticasone (FLOVENT HFA) 110 MCG/ACT inhaler,  Inhale 2 puffs into the lungs 2 (two) times daily., Disp: 1 each, Rfl: 5   HYDROcodone-acetaminophen (NORCO/VICODIN) 5-325 MG tablet, Take 1 tablet by mouth every 6 (six) hours as needed for moderate pain., Disp: 20 tablet, Rfl: 0   Insulin Syringe-Needle U-100 (INSULIN SYRINGE .5CC/28G) 28G X 1/2" 0.5 ML MISC, Use as directed with testosterone, Disp: 10 each, Rfl: 5   Leuprolide Acetate, 3 Month, (LUPRON DEPOT-PED, 84-MONTH, IM), Inject into the muscle., Disp: , Rfl:    Leuprolide Acetate, Ped,,6Mon, (FENSOLVI, 6 MONTH,) 45 MG KIT, Inject 45 mg into the skin every 6 (six) months for 1 dose. [phone] (legal guardian's number), Disp: 1 kit, Rfl: 1   lidocaine-prilocaine (EMLA) cream, Use as directed, Disp: 30 g, Rfl: 0   medroxyPROGESTERone (DEPO-PROVERA) 150 MG/ML injection, Inject 1 mL (150 mg total) into the muscle every 3 (three) months., Disp: 1 mL, Rfl: 4   methylphenidate (RITALIN) 10 MG tablet, Take 1 tablet (10 mg total) by mouth 2 (two) times daily with breakfast and lunch., Disp: 60 tablet, Rfl: 0   mupirocin ointment (BACTROBAN) 2 %, Apply 1 Application topically 2 (two) times daily., Disp: 22 g, Rfl: 0   Spacer/Aero-Holding Chambers DEVI, 1 applicator by Does not apply route as needed., Disp: 1 application, Rfl: 0   testosterone cypionate (DEPOTESTOSTERONE CYPIONATE) 200 MG/ML injection, INJECT 0.2 MLS (40MG  TOTAL) INTO THE SKIN EVERY 7 DAYS., Disp: 4 mL, Rfl: 0   Vitamin D, Ergocalciferol, (DRISDOL) 1.25 MG (50000 UNIT) CAPS capsule, Take  1 capsule (50,000 Units total) by mouth every 7 (seven) days., Disp: 10 capsule, Rfl: 2  Observations/Objective: There were no vitals filed for this visit. Physical Exam Patient is well-developed, well-nourished in no acute distress.  Resting comfortably at home.  Head is normocephalic, atraumatic.  No labored breathing.  Speech is clear and coherent with logical content.  Patient is alert and oriented at baseline.   Assessment and  Plan: Recurrent UTI -     Ambulatory referral to Urology  Cystitis -     Ambulatory referral to Urology -     Phenazopyridine HCl; Take 1 tablet (100 mg total) by mouth 3 (three) times daily as needed for up to 2 days for pain.  Dispense: 6 tablet; Refill: 0  Encouraged the patient to leave a urine sample today The patient was treated for UTI on 12/22/2022 and 02/06/2023 He has been having lingering dysuria since November 2023 and has been treated with Pyridium Refilled Pyridium with instructions provided Referral placed to urology  Please complete the full course of the antibiotics   You can help prevent UTIs by doing the following:  -Avoid holding urine for prolonged periods; this stretches the bladder and causes bacteria to form because bacteria like warm and wet environments to grow -Empty the bladder as soon as the need arises.  -Empty your bladder soon after intercourse.  -Take showers instead of baths -Wipe front to back; doing so after urinating and after a bowel movement helps prevent bacteria in the anal region from spreading to the vagina and urethra. -Also, drink a full glass of water to help flush bacteria.    Follow Up Instructions: For worsening symptoms   I discussed the assessment and treatment plan with the patient. The patient was provided an opportunity to ask questions, and all were answered. The patient agreed with the plan and demonstrated an understanding of the instructions.   The patient was advised to call back or seek an in-person evaluation if the symptoms worsen or if the condition fails to improve as anticipated.  The above assessment and management plan was discussed with the patient. The patient verbalized understanding of and has agreed to the management plan.   Alvira Monday, FNP

## 2023-03-14 NOTE — Addendum Note (Signed)
Addended by: Bolivar Haw on: 03/14/2023 03:20 PM   Modules accepted: Orders

## 2023-03-24 ENCOUNTER — Ambulatory Visit: Payer: Self-pay | Admitting: Family Medicine

## 2023-03-27 ENCOUNTER — Ambulatory Visit: Payer: Medicaid Other | Admitting: Podiatry

## 2023-03-29 ENCOUNTER — Other Ambulatory Visit: Payer: Self-pay | Admitting: Allergy & Immunology

## 2023-03-30 ENCOUNTER — Other Ambulatory Visit: Payer: Self-pay | Admitting: Family Medicine

## 2023-04-03 ENCOUNTER — Telehealth: Payer: Medicaid Other | Admitting: Family Medicine

## 2023-04-04 ENCOUNTER — Encounter (INDEPENDENT_AMBULATORY_CARE_PROVIDER_SITE_OTHER): Payer: Self-pay | Admitting: Pediatric Endocrinology

## 2023-04-04 ENCOUNTER — Ambulatory Visit (INDEPENDENT_AMBULATORY_CARE_PROVIDER_SITE_OTHER): Payer: Medicaid Other | Admitting: Pediatric Endocrinology

## 2023-04-04 VITALS — BP 100/68 | HR 80 | Wt 166.8 lb

## 2023-04-04 DIAGNOSIS — F419 Anxiety disorder, unspecified: Secondary | ICD-10-CM | POA: Diagnosis not present

## 2023-04-04 DIAGNOSIS — F32A Depression, unspecified: Secondary | ICD-10-CM | POA: Diagnosis not present

## 2023-04-04 DIAGNOSIS — E349 Endocrine disorder, unspecified: Secondary | ICD-10-CM

## 2023-04-04 DIAGNOSIS — F649 Gender identity disorder, unspecified: Secondary | ICD-10-CM | POA: Diagnosis not present

## 2023-04-04 LAB — CBC
MPV: 11.3 fL (ref 7.5–12.5)
Platelets: 309 10*3/uL (ref 140–400)

## 2023-04-04 MED ORDER — TESTOSTERONE CYPIONATE 200 MG/ML IM SOLN: 50.0000 mg | INTRAMUSCULAR | 0 refills | Status: DC

## 2023-04-04 NOTE — Patient Instructions (Signed)
Testosterone 50 mg (25 units on insulin syringe or 0.25 ml on 1 mL syringe) weekly.   Referral placed to Dr. Nancie Neas at [facility]. They should call you in the next 2 weeks to schedule a consultation.

## 2023-04-04 NOTE — Progress Notes (Signed)
Pediatric Endocrinology Consultation Follow up Visit  Colleen Lopez 2006-02-05 [id number]  Preferred Name: Colleen Lopez Preferred Pronouns: he/him  HPI: Colleen Lopez is a 17 y.o. assigned female at birth presenting for follow up of gender affirming care. 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 He established care 03/23/2021, and received his first Lupron depot 30mg  injection 04/21/2021, and skipped August 2022 dose. He is accompanied to this visit by his grandmother who is his legal guardian. His grandfather lives in a separate household, and shares medical decision making though he does not attend medical appointments. There are multiple family members who do not know about Colleen Lopez.  2. Colleen Lopez was last seen in pediatric endocrinology on 01/03/23. In the interim he has been doing ok.  He is working towards his top surgery. He has had his consultation with Dr. Leta Baptist and they are now waiting for insurance approval.   He is now living with dad. Mom is still intentionally misgendering him and being antagonistic. Dad is not much better but does not go out of his way to antagonize Colleen Lopez.   He is still planning to move to [place] to be with his GF there in the next year (after surgery).   He has questions about options for bottom surgery including urethral lengthening. He is concerned about the "lack" of "bottom growth" that he has experienced thus far. He is interested in a referral to the urologist at [facility] associated with the gender clinic there.   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   He is taking 40 mg of testosterone every week. No issues with injection. He would like to increase his dose. He also has questions about use of DHT and about the levels of estrogen that he would have if he no longer had ovaries.   He is not currently having periods but he does feel  cramping/PMS/"phantom period" pain.   He is now getting accutaine. His dermatologist was willing to accept that he did not need to be on an OCP to start this.     Body Goals: unchanged -flatter chest -deeper voice -more masculine body structure -more facial hair and other masculine hair growth  3. ROS: Greater than 10 systems reviewed with pertinent positives listed in HPI, otherwise neg.  Past Medical History:   Past Medical History:  Diagnosis Date   Abdominal pain    ADHD (attention deficit hyperactivity disorder)    Asthma    Pervasive developmental disorder    Vision abnormalities    Vomiting   Initial history: When he was 37-25 years old he felt that he was "dysphoric" and fought with his gender identity.  He has always preferred masculine things.  He hated being called anything feminine or being forced to present as female.  He knows that he is not a tomboy. In 5-6th grade he discovered the term "transgender" and feels that best represents him.  Before high school and during the pandemic he "came out."  He socially transitioned 2 years ago.  He cut his hair and school system changed his name at age 18. He is binding sometimes.    He is seeing a psychiatrist, Dr. Diannia Ruder. This provider was reportedly uncomfortable with making the diagnosis of gender dysphoria.  He is also seeing a dermatologist for acne.  Meds: Outpatient Encounter Medications as of 04/04/2023  Medication Sig   [DISCONTINUED] testosterone cypionate (DEPOTESTOSTERONE CYPIONATE) 200 MG/ML injection INJECT  0.2 MLS (40MG  TOTAL) INTO THE SKIN EVERY 7 DAYS.   Adapalene-Benzoyl Peroxide 0.3-2.5 % GEL Apply topically. (Patient not taking: Reported on 04/04/2023)   albuterol (PROAIR HFA) 108 (90 Base) MCG/ACT inhaler Inhale 2 puffs into the lungs every 4 (four) hours as needed for wheezing or shortness of breath. (Patient not taking: Reported on 04/04/2023)   Azelastine HCl 0.15 % SOLN Place 2 sprays into both nostrils  2 (two) times daily. (Patient not taking: Reported on 04/04/2023)   escitalopram (LEXAPRO) 10 MG tablet Take 1 tablet (10 mg total) by mouth daily. (Patient not taking: Reported on 04/04/2023)   fluticasone (FLONASE) 50 MCG/ACT nasal spray Place 2 sprays into both nostrils daily. (Patient not taking: Reported on 04/04/2023)   fluticasone (FLOVENT HFA) 110 MCG/ACT inhaler Inhale 2 puffs into the lungs 2 (two) times daily. (Patient not taking: Reported on 04/04/2023)   HYDROcodone-acetaminophen (NORCO/VICODIN) 5-325 MG tablet Take 1 tablet by mouth every 6 (six) hours as needed for moderate pain. (Patient not taking: Reported on 04/04/2023)   Insulin Syringe-Needle U-100 (INSULIN SYRINGE .5CC/28G) 28G X 1/2" 0.5 ML MISC Use as directed with testosterone (Patient not taking: Reported on 04/04/2023)   Leuprolide Acetate, 3 Month, (LUPRON DEPOT-PED, 26-MONTH, IM) Inject into the muscle. (Patient not taking: Reported on 04/04/2023)   Leuprolide Acetate, Ped,,6Mon, (FENSOLVI, 6 MONTH,) 45 MG KIT Inject 45 mg into the skin every 6 (six) months for 1 dose. [phone] (legal guardian's number) (Patient not taking: Reported on 04/04/2023)   lidocaine-prilocaine (EMLA) cream Use as directed (Patient not taking: Reported on 04/04/2023)   medroxyPROGESTERone (DEPO-PROVERA) 150 MG/ML injection Inject 1 mL (150 mg total) into the muscle every 3 (three) months. (Patient not taking: Reported on 04/04/2023)   methylphenidate (RITALIN) 10 MG tablet Take 1 tablet (10 mg total) by mouth 2 (two) times daily with breakfast and lunch. (Patient not taking: Reported on 04/04/2023)   mupirocin ointment (BACTROBAN) 2 % Apply 1 Application topically 2 (two) times daily. (Patient not taking: Reported on 04/04/2023)   Spacer/Aero-Holding Chambers DEVI 1 applicator by Does not apply route as needed. (Patient not taking: Reported on 04/04/2023)   testosterone cypionate (DEPOTESTOSTERONE CYPIONATE) 200 MG/ML injection Inject 0.25 mLs (50 mg total)  into the skin every 7 (seven) days.   Vitamin D, Ergocalciferol, (DRISDOL) 1.25 MG (50000 UNIT) CAPS capsule Take 1 capsule (50,000 Units total) by mouth every 7 (seven) days. (Patient not taking: Reported on 04/04/2023)   No facility-administered encounter medications on file as of 04/04/2023.    Allergies: No Known Allergies  Surgical History: Past Surgical History:  Procedure Laterality Date   septorhinoplasty  07/2020     Family History:  Family History  Problem Relation Age of Onset   Obesity Maternal Grandmother    Diabetes type II Maternal Grandmother    Hypertension Maternal Grandmother    COPD Maternal Grandmother    Hyperlipidemia Maternal Grandmother    Heart disease Maternal Grandmother    Aneurysm Maternal Grandfather    Early death Maternal Grandfather    Bipolar disorder Mother    Drug abuse Mother    Drug abuse Father    Alcohol abuse Father    ADD / ADHD Brother    Bipolar disorder Paternal Uncle    Migraines Neg Hx     Social History: Social History   Social History Narrative   ** Merged History Encounter **       Lives at home with grandmother and step grandfather, aunt and two half  brothers. 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      Lives with [name] and 2 half brothers.    He is going to work towards getting GED   He enjoys playing bass, (Psychiatric nurse - guitar)  drawing, and fantasy world building.       Getting his GED. 23-24 school year  In a relationship with a trans-female. Non- penetrative intercourse  Physical Exam:  Vitals:   04/04/23 1534  BP: 100/68  Pulse: 80  Weight: 166 lb 12.8 oz (75.7 kg)   BP 100/68   Pulse 80   Wt 166 lb 12.8 oz (75.7 kg)   BMI 27.76 kg/m  Body mass index: body mass index is 27.76 kg/m. Blood pressure %iles are not available for patients who are 18 years or older.  Wt Readings from Last 3 Encounters:  04/04/23 166 lb 12.8 oz (75.7 kg)  (92 %, Z= 1.39)*  01/30/23 163 lb (73.9 kg) (91 %, Z= 1.31)*  01/06/23 167 lb (75.8 kg) (92 %, Z= 1.41)*   * Growth percentiles are based on CDC (Girls, 2-20 Years) data.   Ht Readings from Last 3 Encounters:  01/30/23 5\' 4"  (1.626 m) (46 %, Z= -0.10)*  01/06/23 5\' 4"  (1.626 m) (46 %, Z= -0.10)*  01/03/23 5' 4.41" (1.636 m) (53 %, Z= 0.07)*   * Growth percentiles are based on CDC (Girls, 2-20 Years) data.   Weight is basically stable.    Physical Exam Vitals reviewed.  Constitutional:      Appearance: Normal appearance.  HENT:     Head: Normocephalic and atraumatic.     Nose: Nose normal.     Mouth/Throat:     Mouth: Mucous membranes are moist.  Eyes:     Extraocular Movements: Extraocular movements intact.     Comments: glasses  Cardiovascular:     Pulses: Normal pulses.  Pulmonary:     Effort: Pulmonary effort is normal. No respiratory distress.  Abdominal:     General: There is no distension.  Musculoskeletal:        General: Normal range of motion.     Cervical back: Normal range of motion and neck supple.  Skin:    General: Skin is warm.     Capillary Refill: Capillary refill takes less than 2 seconds.     Findings: No rash.  Neurological:     General: No focal deficit present.     Mental Status: He is alert.     Gait: Gait normal.  Psychiatric:        Mood and Affect: Mood normal.        Behavior: Behavior normal.      Labs: Results for orders placed or performed in visit on 03/14/23  POCT URINALYSIS DIP (CLINITEK)  Result Value Ref Range   Color, UA yellow yellow   Clarity, UA clear clear   Glucose, UA negative negative mg/dL   Bilirubin, UA negative negative   Ketones, POC UA negative negative mg/dL   Spec Grav, UA 4.098 1.191 - 1.025   Blood, UA negative negative   pH, UA 6.5 5.0 - 8.0   POC PROTEIN,UA negative negative, trace   Urobilinogen, UA 0.2 0.2 or 1.0 E.U./dL   Nitrite, UA Negative Negative   Leukocytes, UA Negative Negative     Assessment/Plan: Freada Alston is a 17 y.o. 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   He has been receiving GnRH agonist therapy with Colleen Lopez  He is interested in continuing on [facility] for now. He is anxious that with Norethindrone or Depo Provera he will have issues with dysregulated menstrual bleeding.   He has been on testosterone 40 mg q week since September 2023  Endocrine disorder related to puberty  - Increase Testosterone 50 mg weekly (25 units on insulin syringe) - Insulin syringes provided in clinic today.  - Labs today - Questions about surgical options - Discussed urologic surgery and referral placed to Dr. Guy Sandifer - He will discuss GYN surgical options with his GYN doctor - Questions about DHT- will investigate further   Orders Placed This Encounter  Procedures   Testosterone Total,Free,Bio, Males   Estradiol, Ultra Sens   Anti mullerian hormone   CBC   Ambulatory referral to Urology    Referral Priority:   Routine    Referral Type:   Consultation    Referral Reason:   Specialty Services Required    Referred to Provider:   Turner Daniels, MD    Requested Specialty:   Urology    Number of Visits Requested:   1     Follow-up:   Return in about 3 months (around 07/03/2023).    Medical decision-making:   >40 minutes spent today reviewing the medical chart, counseling the patient/family, and documenting today's encounter.   Thank you for the opportunity to participate in the care of your patient. Please do not hesitate to contact me should you have any questions regarding the assessment or treatment plan.   Sincerely,   Dessa Phi, MD

## 2023-04-05 ENCOUNTER — Encounter: Payer: Medicaid Other | Admitting: Family Medicine

## 2023-04-07 LAB — CBC
MCH: 29.4 pg (ref 25.0–35.0)
MCV: 85.4 fL (ref 78.0–98.0)

## 2023-04-07 NOTE — Progress Notes (Signed)
Erroneous encounter-disregard

## 2023-04-08 LAB — CBC
RBC: 5.14 10*6/uL — ABNORMAL HIGH (ref 3.80–5.10)
WBC: 8 10*3/uL (ref 4.5–13.0)

## 2023-04-08 LAB — ANTI-MULLERIAN HORMONE (AMH), FEMALE: Anti-Mullerian Hormones(AMH), Female: 1.99 ng/mL (ref 1.02–14.63)

## 2023-04-10 LAB — ESTRADIOL, ULTRA SENS: Estradiol, Ultra Sensitive: 52 pg/mL

## 2023-04-10 LAB — CP TESTOSTERONE, BIO-FEMALE/CHILDREN
Albumin: 4.6 g/dL (ref 3.6–5.1)
Sex Hormone Binding: 12.3 nmol/L — ABNORMAL LOW (ref 17–124)
TESTOSTERONE, BIOAVAILABLE: 335.9 ng/dL — ABNORMAL HIGH (ref 0.5–8.5)
Testosterone, Free: 159.9 pg/mL — ABNORMAL HIGH (ref 0.2–5.0)
Testosterone, Total, LC-MS-MS: 589 ng/dL — ABNORMAL HIGH (ref 2–45)

## 2023-04-10 LAB — CBC
HCT: 43.9 % (ref 34.0–46.0)
Hemoglobin: 15.1 g/dL (ref 11.5–15.3)
MCHC: 34.4 g/dL (ref 31.0–36.0)
RDW: 13.2 % (ref 11.0–15.0)

## 2023-04-18 ENCOUNTER — Ambulatory Visit: Payer: Medicaid Other | Admitting: Podiatry

## 2023-04-20 ENCOUNTER — Ambulatory Visit: Payer: Medicaid Other

## 2023-04-26 ENCOUNTER — Encounter: Payer: Self-pay | Admitting: Family Medicine

## 2023-04-26 ENCOUNTER — Ambulatory Visit (INDEPENDENT_AMBULATORY_CARE_PROVIDER_SITE_OTHER): Payer: Medicaid Other | Admitting: Family Medicine

## 2023-04-26 VITALS — BP 118/76 | HR 82 | Ht 65.0 in | Wt 160.0 lb

## 2023-04-26 DIAGNOSIS — E7849 Other hyperlipidemia: Secondary | ICD-10-CM

## 2023-04-26 DIAGNOSIS — G479 Sleep disorder, unspecified: Secondary | ICD-10-CM | POA: Insufficient documentation

## 2023-04-26 DIAGNOSIS — L700 Acne vulgaris: Secondary | ICD-10-CM

## 2023-04-26 MED ORDER — HYDROXYZINE PAMOATE 25 MG PO CAPS
25.0000 mg | ORAL_CAPSULE | Freq: Every day | ORAL | 1 refills | Status: DC
Start: 2023-04-26 — End: 2023-08-29

## 2023-04-26 NOTE — Assessment & Plan Note (Signed)
He follows up with his dermatologist at [facility] He is currently being treated with Accutane 80 mg Will assess his triglyceride and hepatitis panel today No complaints or concerns voiced

## 2023-04-26 NOTE — Patient Instructions (Addendum)
I appreciate the opportunity to provide care to you today!    Follow up:  05/10/2023  Labs: please stop by the lab today to get your blood drawn (LFT and Lipid profile )     Please continue to a heart-healthy diet and increase your physical activities. Try to exercise for at least five days a week.      It was a pleasure to see you and I look forward to continuing to work together on your health and well-being. Please do not hesitate to call the office if you need care or have questions about your care.   Have a wonderful day and week. With Gratitude, Gilmore Laroche MSN, FNP-BC

## 2023-04-26 NOTE — Assessment & Plan Note (Signed)
Chronic condition Reports getting a maximum of 4 hours of sleep nightly Has tried melatonin and trazodone with minimal relief Has tried Seroquel 25 mg send complains of tremors Will treat today with hydroxyzine 25 mg qhx Encouraged to take hydroxyzine 25 mg qhs for 2 weeks and 50 mg qhs for 2 weeks

## 2023-04-26 NOTE — Progress Notes (Signed)
lipid  Established Patient Office Visit  Subjective:  Patient ID: Colleen Lopez, adult    DOB: 26-Jan-2006  Age: 17 y.o. MRN: [id number]  CC:  Chief Complaint  Patient presents with   Chronic Care Management    Pt would like labs today, states he will be getting procedure at dermatology and test need to be done before. Labs needed to be checked: LFT and TGL (patient needs labs done through pcp and send a copy to dermatology)    HPI Colleen Lopez is a 17 y.o. adult presents for labs.  For the details of today's visit, please refer to the assessment and plan.    Past Medical History:  Diagnosis Date   Abdominal pain    ADHD (attention deficit hyperactivity disorder)    Asthma    Pervasive developmental disorder    Vision abnormalities    Vomiting     Past Surgical History:  Procedure Laterality Date   septorhinoplasty  07/2020    Family History  Problem Relation Age of Onset   Obesity Maternal Grandmother    Diabetes type II Maternal Grandmother    Hypertension Maternal Grandmother    COPD Maternal Grandmother    Hyperlipidemia Maternal Grandmother    Heart disease Maternal Grandmother    Aneurysm Maternal Grandfather    Early death Maternal Grandfather    Bipolar disorder Mother    Drug abuse Mother    Drug abuse Father    Alcohol abuse Father    ADD / ADHD Brother    Bipolar disorder Paternal Uncle    Migraines Neg Hx     Social History   Socioeconomic History   Marital status: Significant Other    Spouse name: Not on file   Number of children: Not on file   Years of education: Not on file   Highest education level: Not on file  Occupational History   Not on file  Tobacco Use   Smoking status: Never    Passive exposure: Yes   Smokeless tobacco: Never  Substance and Sexual Activity   Alcohol use: No   Drug use: No   Sexual activity: Never  Other Topics Concern   Not on file  Social History Narrative   ** Merged History Encounter **       Lives at  home with grandmother and step grandfather, aunt and two half brothers. 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      Lives with [name] and 2 half brothers.    He is going to work towards getting GED   He enjoys playing bass, (Psychiatric nurse - guitar)  drawing, and fantasy world building.       Getting his GED. 23-24 school year   Social Determinants of Health   Financial Resource Strain: Not on file  Food Insecurity: Not on file  Transportation Needs: Not on file  Physical Activity: Not on file  Stress: Not on file  Social Connections: Not on file  Intimate Partner Violence: Not on file    Outpatient Medications Prior to Visit  Medication Sig Dispense Refill   fluticasone (FLOVENT HFA) 110 MCG/ACT inhaler Inhale 2 puffs into the lungs 2 (two) times daily. 1 each 5   Insulin Syringe-Needle U-100 (INSULIN SYRINGE .5CC/28G) 28G X 1/2" 0.5 ML MISC Use as directed with testosterone 10 each 5   ISOtretinoin (ACCUTANE) 40 MG capsule Take 80 mg by mouth 2 (two) times daily.  Leuprolide Acetate, 3 Month, (LUPRON DEPOT-PED, 45-MONTH, IM) Inject into the muscle.     testosterone cypionate (DEPOTESTOSTERONE CYPIONATE) 200 MG/ML injection Inject 0.25 mLs (50 mg total) into the skin every 7 (seven) days. 4 mL 0   Adapalene-Benzoyl Peroxide 0.3-2.5 % GEL Apply topically. (Patient not taking: Reported on 04/04/2023)     albuterol (PROAIR HFA) 108 (90 Base) MCG/ACT inhaler Inhale 2 puffs into the lungs every 4 (four) hours as needed for wheezing or shortness of breath. (Patient not taking: Reported on 04/04/2023) 1 each 3   Azelastine HCl 0.15 % SOLN Place 2 sprays into both nostrils 2 (two) times daily. (Patient not taking: Reported on 04/04/2023) 30 mL 5   escitalopram (LEXAPRO) 10 MG tablet Take 1 tablet (10 mg total) by mouth daily. (Patient not taking: Reported on 04/04/2023) 30 tablet 2   fluticasone (FLONASE) 50 MCG/ACT nasal spray Place  2 sprays into both nostrils daily. (Patient not taking: Reported on 04/04/2023) 16 g 5   HYDROcodone-acetaminophen (NORCO/VICODIN) 5-325 MG tablet Take 1 tablet by mouth every 6 (six) hours as needed for moderate pain. (Patient not taking: Reported on 04/26/2023) 20 tablet 0   Leuprolide Acetate, Ped,,6Mon, (FENSOLVI, 6 MONTH,) 45 MG KIT Inject 45 mg into the skin every 6 (six) months for 1 dose. [phone] (legal guardian's number) (Patient not taking: Reported on 04/04/2023) 1 kit 1   lidocaine-prilocaine (EMLA) cream Use as directed (Patient not taking: Reported on 04/04/2023) 30 g 0   medroxyPROGESTERone (DEPO-PROVERA) 150 MG/ML injection Inject 1 mL (150 mg total) into the muscle every 3 (three) months. (Patient not taking: Reported on 04/04/2023) 1 mL 4   methylphenidate (RITALIN) 10 MG tablet Take 1 tablet (10 mg total) by mouth 2 (two) times daily with breakfast and lunch. (Patient not taking: Reported on 04/04/2023) 60 tablet 0   mupirocin ointment (BACTROBAN) 2 % Apply 1 Application topically 2 (two) times daily. (Patient not taking: Reported on 04/04/2023) 22 g 0   Spacer/Aero-Holding Chambers DEVI 1 applicator by Does not apply route as needed. (Patient not taking: Reported on 04/04/2023) 1 application 0   Vitamin D, Ergocalciferol, (DRISDOL) 1.25 MG (50000 UNIT) CAPS capsule Take 1 capsule (50,000 Units total) by mouth every 7 (seven) days. (Patient not taking: Reported on 04/04/2023) 10 capsule 2   No facility-administered medications prior to visit.    No Known Allergies  ROS Review of Systems  Constitutional:  Negative for chills and fever.  Eyes:  Negative for visual disturbance.  Respiratory:  Negative for chest tightness and shortness of breath.   Neurological:  Negative for dizziness and headaches.  Psychiatric/Behavioral:  Positive for sleep disturbance.       Objective:    Physical Exam HENT:     Head: Normocephalic.     Mouth/Throat:     Mouth: Mucous membranes are  moist.  Cardiovascular:     Rate and Rhythm: Normal rate.     Heart sounds: Normal heart sounds.  Pulmonary:     Effort: Pulmonary effort is normal.     Breath sounds: Normal breath sounds.  Neurological:     Mental Status: He is alert.     BP 118/76   Pulse 82   Ht 5\' 5"  (1.651 m)   Wt 160 lb (72.6 kg)   SpO2 94%   BMI 26.63 kg/m  Wt Readings from Last 3 Encounters:  04/26/23 160 lb (72.6 kg) (89 %, Z= 1.22)*  04/04/23 166 lb 12.8 oz (75.7 kg) (92 %,  Z= 1.39)*  01/30/23 163 lb (73.9 kg) (91 %, Z= 1.31)*   * Growth percentiles are based on CDC (Girls, 2-20 Years) data.    Lab Results  Component Value Date   TSH 1.340 12/22/2022   Lab Results  Component Value Date   WBC 8.0 04/04/2023   HGB 15.1 04/04/2023   HCT 43.9 04/04/2023   MCV 85.4 04/04/2023   PLT 309 04/04/2023   Lab Results  Component Value Date   NA 139 12/22/2022   K 4.4 12/22/2022   CO2 21 12/22/2022   GLUCOSE 90 12/22/2022   BUN 9 12/22/2022   CREATININE 0.91 12/22/2022   BILITOT 0.5 12/22/2022   ALKPHOS 102 12/22/2022   AST 18 12/22/2022   ALT 19 12/22/2022   PROT 7.5 12/22/2022   ALBUMIN 4.9 12/22/2022   CALCIUM 10.0 12/22/2022   EGFR 94 12/22/2022   Lab Results  Component Value Date   CHOL 163 12/22/2022   Lab Results  Component Value Date   HDL 30 (L) 12/22/2022   Lab Results  Component Value Date   LDLCALC 110 (H) 12/22/2022   Lab Results  Component Value Date   TRIG 125 (H) 12/22/2022   Lab Results  Component Value Date   CHOLHDL 5.4 (H) 12/22/2022   Lab Results  Component Value Date   HGBA1C 5.4 12/22/2022      Assessment & Plan:  Sleep disturbance Assessment & Plan: Chronic condition Reports getting a maximum of 4 hours of sleep nightly Has tried melatonin and trazodone with minimal relief Has tried Seroquel 25 mg send complains of tremors Will treat today with hydroxyzine 25 mg qhx Encouraged to take hydroxyzine 25 mg qhs for 2 weeks and 50 mg qhs for 2  weeks  Orders: -     hydrOXYzine Pamoate; Take 1 capsule (25 mg total) by mouth at bedtime.  Dispense: 90 capsule; Refill: 1  Acne vulgaris Assessment & Plan: He follows up with his dermatologist at [facility] He is currently being treated with Accutane 80 mg Will assess his triglyceride and hepatitis panel today No complaints or concerns voiced   Other hyperlipidemia -     Lipid panel -     Hepatic function panel    Follow-up: Return in about 2 weeks (around 05/10/2023).   Gilmore Laroche, FNP

## 2023-04-27 LAB — LIPID PANEL
Chol/HDL Ratio: 4.4 ratio (ref 0.0–4.4)
Cholesterol, Total: 114 mg/dL (ref 100–169)
HDL: 26 mg/dL — ABNORMAL LOW (ref >39)
LDL Chol Calc (NIH): 63 mg/dL (ref 0–109)
Triglycerides: 140 mg/dL — ABNORMAL HIGH (ref 0–89)
VLDL Cholesterol Cal: 25 mg/dL (ref 5–40)

## 2023-04-27 LAB — HEPATIC FUNCTION PANEL
ALT: 10 IU/L (ref 0–32)
AST: 15 IU/L (ref 0–40)
Albumin: 4.7 g/dL (ref 4.0–5.0)
Alkaline Phosphatase: 99 IU/L (ref 42–106)
Bilirubin Total: 0.8 mg/dL (ref 0.0–1.2)
Bilirubin, Direct: 0.17 mg/dL (ref 0.00–0.40)
Total Protein: 7.6 g/dL (ref 6.0–8.5)

## 2023-04-27 NOTE — Progress Notes (Signed)
Please inform the patient that his triglyceride levels are elevated, likely due to Accutane therapy. I recommend avoiding simple carbohydrates, including cakes, sweet desserts, ice cream, soda (diet or regular), sweet tea, candies, chips, cookies, store-bought juices, alcohol in excess of 1-2 drinks a day, lemonade, artificial sweeteners, donuts, coffee creamers, and sugar-free products.  I recommend avoiding greasy, fatty foods with increased physical activity.  Kindly fax results to his dermatologist

## 2023-05-03 ENCOUNTER — Ambulatory Visit (HOSPITAL_COMMUNITY): Payer: No Typology Code available for payment source | Admitting: Psychiatry

## 2023-05-04 ENCOUNTER — Telehealth (INDEPENDENT_AMBULATORY_CARE_PROVIDER_SITE_OTHER): Payer: Self-pay

## 2023-05-04 NOTE — Telephone Encounter (Signed)
Fax received from Urology place we referred pt to stating pt has not been able to be reached to set up appt. Tried calling pt, vm full. Will send mychart message.

## 2023-05-10 ENCOUNTER — Ambulatory Visit: Payer: Self-pay | Admitting: Family Medicine

## 2023-05-15 ENCOUNTER — Encounter (INDEPENDENT_AMBULATORY_CARE_PROVIDER_SITE_OTHER): Payer: Self-pay | Admitting: Pediatric Endocrinology

## 2023-05-16 MED ORDER — NORETHINDRONE ACETATE 5 MG PO TABS: 5.0000 mg | ORAL_TABLET | Freq: Every day | ORAL | 3 refills | Status: DC

## 2023-05-22 ENCOUNTER — Ambulatory Visit: Payer: Self-pay | Admitting: Family Medicine

## 2023-05-23 ENCOUNTER — Encounter: Payer: Self-pay | Admitting: Urology

## 2023-05-23 ENCOUNTER — Ambulatory Visit (INDEPENDENT_AMBULATORY_CARE_PROVIDER_SITE_OTHER): Payer: Medicaid Other | Admitting: Urology

## 2023-05-23 VITALS — BP 110/69 | HR 118

## 2023-05-23 DIAGNOSIS — N3000 Acute cystitis without hematuria: Secondary | ICD-10-CM

## 2023-05-23 DIAGNOSIS — N390 Urinary tract infection, site not specified: Secondary | ICD-10-CM

## 2023-05-23 LAB — MICROSCOPIC EXAMINATION: RBC, Urine: >30 /hpf — AB (ref 0–2)

## 2023-05-23 LAB — URINALYSIS, ROUTINE W REFLEX MICROSCOPIC
Bilirubin, UA: NEGATIVE
Nitrite, UA: POSITIVE — AB
Specific Gravity, UA: 1.02 (ref 1.005–1.030)
Urobilinogen, Ur: 4 mg/dL — ABNORMAL HIGH (ref 0.2–1.0)
pH, UA: 6.5 (ref 5.0–7.5)

## 2023-05-23 LAB — BLADDER SCAN AMB NON-IMAGING: Scan Result: 0

## 2023-05-23 MED ORDER — SULFAMETHOXAZOLE-TRIMETHOPRIM 800-160 MG PO TABS
1.0000 | ORAL_TABLET | Freq: Two times a day (BID) | ORAL | 0 refills | Status: DC
Start: 2023-05-23 — End: 2023-07-04

## 2023-05-23 NOTE — Progress Notes (Signed)
H&P  Chief Complaint: Recurrent urinary tract infections  History of Present Illness: 17 year old individual undergoing sexual/gender reassignment, female to female.  Will be having top surgery within the next 2 to 3 weeks.  "Colleen Lopez" comes in today complaining of intermittent urinary tract infections for over a year.  Has been treating these with AZO and d-mannose.  Has taken cephalexin in the past, but not currently.  Has had 1 or 2 visits to emergency room.  I see urinalyses records, but no evident urine cultures.  Currently having dysuria, frequency, urgency.  Dark urine when he is not using AZO.  No gross hematuria however.  Denies any prior stone episodes.  Denies Juvenal UTIs.  No associated febrile issues.  Past Medical History:  Diagnosis Date   Abdominal pain    ADHD (attention deficit hyperactivity disorder)    Asthma    Pervasive developmental disorder    Vision abnormalities    Vomiting     Past Surgical History:  Procedure Laterality Date   septorhinoplasty  07/2020    Home Medications:  Allergies as of 05/23/2023   No Known Allergies      Medication List        Accurate as of May 23, 2023  8:27 AM. If you have any questions, ask your nurse or doctor.          Adapalene-Benzoyl Peroxide 0.3-2.5 % Gel Apply topically.   albuterol 108 (90 Base) MCG/ACT inhaler Commonly known as: ProAir HFA Inhale 2 puffs into the lungs every 4 (four) hours as needed for wheezing or shortness of breath.   Azelastine HCl 0.15 % Soln Place 2 sprays into both nostrils 2 (two) times daily.   escitalopram 10 MG tablet Commonly known as: Lexapro Take 1 tablet (10 mg total) by mouth daily.   Fensolvi (6 Month) 45 MG Kit injection Generic drug: leuprolide (Ped) (6 month) Inject 45 mg into the skin every 6 (six) months for 1 dose. [id number] (legal guardian's number)   fluticasone 110 MCG/ACT inhaler Commonly known as: Flovent HFA Inhale 2 puffs into the lungs 2 (two) times  daily.   fluticasone 50 MCG/ACT nasal spray Commonly known as: FLONASE Place 2 sprays into both nostrils daily.   HYDROcodone-acetaminophen 5-325 MG tablet Commonly known as: NORCO/VICODIN Take 1 tablet by mouth every 6 (six) hours as needed for moderate pain.   hydrOXYzine 25 MG capsule Commonly known as: VISTARIL Take 1 capsule (25 mg total) by mouth at bedtime.   INSULIN SYRINGE .5CC/28G 28G X 1/2" 0.5 ML Misc Use as directed with testosterone   ISOtretinoin 40 MG capsule Commonly known as: ACCUTANE Take 80 mg by mouth 2 (two) times daily.   lidocaine-prilocaine cream Commonly known as: EMLA Use as directed   LUPRON DEPOT-PED (98-MONTH) IM Inject into the muscle.   medroxyPROGESTERone 150 MG/ML injection Commonly known as: DEPO-PROVERA Inject 1 mL (150 mg total) into the muscle every 3 (three) months.   methylphenidate 10 MG tablet Commonly known as: Ritalin Take 1 tablet (10 mg total) by mouth 2 (two) times daily with breakfast and lunch.   mupirocin ointment 2 % Commonly known as: BACTROBAN Apply 1 Application topically 2 (two) times daily.   norethindrone 5 MG tablet Commonly known as: AYGESTIN Take 1-3 tablets (5-15 mg total) by mouth daily. Take 3 tabs daily for menstrual bleeding (until bleeding stops/slows). Take 2 pills daily for spotting. Take 1 pill daily baseline. Do not take Melatonin with this medication.   Spacer/Aero-Holding [employer] 1  applicator by Does not apply route as needed.   testosterone cypionate 200 MG/ML injection Commonly known as: DEPOTESTOSTERONE CYPIONATE Inject 0.25 mLs (50 mg total) into the skin every 7 (seven) days.   Vitamin D (Ergocalciferol) 1.25 MG (50000 UNIT) Caps capsule Commonly known as: DRISDOL Take 1 capsule (50,000 Units total) by mouth every 7 (seven) days.        Allergies: No Known Allergies  Family History  Problem Relation Age of Onset   Obesity Maternal Grandmother    Diabetes type II Maternal  Grandmother    Hypertension Maternal Grandmother    COPD Maternal Grandmother    Hyperlipidemia Maternal Grandmother    Heart disease Maternal Grandmother    Aneurysm Maternal Grandfather    Early death Maternal Grandfather    Bipolar disorder Mother    Drug abuse Mother    Drug abuse Father    Alcohol abuse Father    ADD / ADHD Brother    Bipolar disorder Paternal Uncle    Migraines Neg Hx     Social History:  reports that he has never smoked. He has been exposed to tobacco smoke. He has never used smokeless tobacco. He reports that he does not drink alcohol and does not use drugs.  ROS: A complete review of systems was performed.  All systems are negative except for pertinent findings as noted.  Physical Exam:  Vital signs in last 24 hours: There were no vitals taken for this visit. Constitutional:  Alert and oriented, No acute distress Cardiovascular: Regular rate  Respiratory: Normal respiratory effort Neurologic: Grossly intact, no focal deficits Psychiatric: Normal mood and affect  I have reviewed prior pt notes  I have reviewed notes by way of care everywhere  I have reviewed urinalysis results  I have independently reviewed bladder scan   I have reviewed prior urine culture--only positive culture I can see in the past has been E. coli   Impression/Assessment:  Recurrent cystitis, relatively uncomplicated an 17 year old individual  Plan:  I will send urine for culture today.  I put Colleen Lopez on a 5-day course of Bactrim  I will have him come back in to check here in about 10 days after completing Bactrim, prior to top surgery

## 2023-05-25 ENCOUNTER — Other Ambulatory Visit (HOSPITAL_COMMUNITY): Payer: Self-pay | Admitting: Psychiatry

## 2023-05-25 LAB — URINE CULTURE

## 2023-05-25 MED ORDER — METHYLPHENIDATE HCL 10 MG PO TABS: 10.0000 mg | ORAL_TABLET | Freq: Two times a day (BID) | ORAL | 0 refills | Status: DC

## 2023-05-31 NOTE — H&P (Signed)
Subjective:    Patient ID: Colleen Lopez is a 17 y.o. adult.   HPI   Returns for follow up discussion gender affirming surgery. Last seen 11/2022. Patient has identified as female with preferred name Colleen Lopez since 2020 in all aspect of life. Previously used trans tape or binding, currently wears loose clothing.   Starts testosterone this month. On lupron for year, has not menstruated since that time.    No prior MMG. Reports breast cancer present on maternal side.   Wt down 8 lb since time of consult. Has had ED visit earlier this year for self cutting. Reports mood improved.    Lives with GM. Not currently working or in school, hopes to complete GED.   Review of Systems Remainder 12 point review negative   Objective:  Physical Exam Cardiovascular:     Rate and Rhythm: Normal rate.   Normal heart sounds Pulmonary:     Effort: Pulmonary effort is normal.   Clear to audcultation Lymphadenopathy:     Upper Body:     Right upper body: No axillary adenopathy.     Left upper body: No axillary adenopathy.  Skin:    Comments: Fitzpatrick 2    Breasts: no ptosis bilateral no masses SN to nipple R 21 L 21 cm BW R 18 L 18 cm Nipple to IMF R 7 L 7 cm Right medial extent IMF with faded fine line scar from cartilage/rib harvest for rhinoplasty     Assessment:    Gender dysphoria   Plan:      At his age do not require MMG prior to surgery.    Plan bilateral mastectomies with free nipple grafts.   Discussed with patient mastectomy does not remove 100% breast tissue and will need to continue with self exams with aging with regards to breast cancer risk. Reviewed mastectomy will leave chest skin and nipple areola without feeling or ability to stimulate. Given breast size recommend mastectomy with via FNG. Reviewed scars, drains, OP surgery, post op limitations pain and recovery. Reviewed with FNG able to reduce nipple height and areola diameter to approach cis female appearance but risks  failure graft and hypopigmentation, may need additional procedures for this. Reviewed risks hyper and hypopigmentations mastectomy scars and length.   Additional risks including but not limited to bleeding, hematoma, seroma, infection, wound healing problems, damage to adjacent structures, need for additional procedures, asymmetry, unacceptable cosmetic result, blood clots in legs or lungs reviewed. Completed ASPS female to female chest surgery consent.    Drain teaching completed. Rx for tramadol given.  Glenna Fellows, MD [facility]  Office/ physician access line after hours [phone]

## 2023-05-31 NOTE — Progress Notes (Signed)
History of Present Illness: Colleen Lopez is a 17 y.o. adult who presents today for follow up visit at [facility] [place]. - GU history: 1. 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  - Follows with Endocrinology (Dr. Vanessa Rifton) for gender affirming care. Per last visit note, referral place to Dr. Guy Sandifer at [facility] to discuss bottom surgery.  - Follows with Behavioral Health (Dr. Tenny Craw). - Takes hormone therapy including testosterone and Lupron.  Colleen Lopez was seen for initial consultation by Dr. Retta Diones on 05/23/2023. - Chief complaint: Intermittent UTI symptoms (dysuria, frequency, urgency) x1 year.  - Urine microscopy showed 6-10 WBC/hpf, >30 RBC/hpf, and few bacteria. Urine culture was negative. Previous urine culture on 12/22/2022 was also negative. No positive urine culture results in past 12 months found per chart review. - Prior treatment has included: Pyridium (AZO) and D-mannose. Keflex at times. - No past history of kidney stones. - He was prescribed Bactrim DS for possible UTI.  Today: He reports mild dysuria, urgency, and frequency. Overall he states symptoms are much improved after taking the antibiotic treatment. He denies increased urinary gross hematuria, straining to void, or sensations of incomplete emptying. Denies fevers.   He states he doesn't have an appointment scheduled yet with Dr. Guy Sandifer at [facility]; he wanted to address the UTI concerns first before proceeding with major pelvic reconstruction (bottom surgery). He is scheduled for bilateral simple mastectomy with Dr. Leta Baptist (plastic surgery) in 1 week (on 06/09/2023).  He reports that he continues to menstruate on current dose of testosterone.    Fall Screening: Do you usually have a device to assist in your mobility? No   Medications: Current Outpatient Medications  Medication Sig Dispense Refill   Adapalene-Benzoyl Peroxide 0.3-2.5 % GEL Apply topically.     albuterol (PROAIR HFA) 108 (90 Base) MCG/ACT inhaler  Inhale 2 puffs into the lungs every 4 (four) hours as needed for wheezing or shortness of breath. 1 each 3   fluticasone (FLONASE) 50 MCG/ACT nasal spray Place 2 sprays into both nostrils daily. 16 g 5   fluticasone (FLOVENT HFA) 110 MCG/ACT inhaler Inhale 2 puffs into the lungs 2 (two) times daily. 1 each 5   HYDROcodone-acetaminophen (NORCO/VICODIN) 5-325 MG tablet Take 1 tablet by mouth every 6 (six) hours as needed for moderate pain. 20 tablet 0   hydrOXYzine (VISTARIL) 25 MG capsule Take 1 capsule (25 mg total) by mouth at bedtime. 90 capsule 1   Insulin Syringe-Needle U-100 (INSULIN SYRINGE .5CC/28G) 28G X 1/2" 0.5 ML MISC Use as directed with testosterone 10 each 5   ISOtretinoin (ACCUTANE) 40 MG capsule Take 80 mg by mouth 2 (two) times daily.     lidocaine-prilocaine (EMLA) cream Use as directed 30 g 0   methylphenidate (RITALIN) 10 MG tablet Take 1 tablet (10 mg total) by mouth 2 (two) times daily with breakfast and lunch. 60 tablet 0   norethindrone (AYGESTIN) 5 MG tablet Take 1-3 tablets (5-15 mg total) by mouth daily. Take 3 tabs daily for menstrual bleeding (until bleeding stops/slows). Take 2 pills daily for spotting. Take 1 pill daily baseline. Do not take Melatonin with this medication. 60 tablet 3   Spacer/Aero-Holding [employer]VI 1 applicator by Does not apply route as needed. 1 application 0   sulfamethoxazole-trimethoprim (BACTRIM DS) 800-160 MG tablet Take 1 tablet by mouth 2 (two) times daily. 10 tablet 0   testosterone cypionate (DEPOTESTOSTERONE CYPIONATE) 200 MG/ML injection Inject 0.25 mLs (50 mg total) into the skin every 7 (seven)  days. 4 mL 0   No current facility-administered medications for this visit.    Allergies: No Known Allergies  Past Medical History:  Diagnosis Date   Abdominal pain    ADHD (attention deficit hyperactivity disorder)    Asthma    Pervasive developmental disorder    PONV (postoperative nausea and vomiting)    Vision abnormalities     Vomiting    Past Surgical History:  Procedure Laterality Date   septorhinoplasty  07/2020   Family History  Problem Relation Age of Onset   Obesity Maternal Grandmother    Diabetes type II Maternal Grandmother    Hypertension Maternal Grandmother    COPD Maternal Grandmother    Hyperlipidemia Maternal Grandmother    Heart disease Maternal Grandmother    Aneurysm Maternal Grandfather    Early death Maternal Grandfather    Bipolar disorder Mother    Drug abuse Mother    Drug abuse Father    Alcohol abuse Father    ADD / ADHD Brother    Bipolar disorder Paternal Uncle    Migraines Neg Hx    Social History   Socioeconomic History   Marital status: Significant Other    Spouse name: Not on file   Number of children: Not on file   Years of education: Not on file   Highest education level: Not on file  Occupational History   Not on file  Tobacco Use   Smoking status: Never    Passive exposure: Yes   Smokeless tobacco: Never  Vaping Use   Vaping Use: Never used  Substance and Sexual Activity   Alcohol use: No   Drug use: No   Sexual activity: Never  Other Topics Concern   Not on file  Social History Narrative   ** Merged History Encounter **       Lives at home with grandmother and step grandfather, aunt and two half brothers. 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      Lives with [name] and 2 half brothers.    He is going to work towards getting GED   He enjoys playing bass, (Psychiatric nurse - guitar)  drawing, and fantasy world building.       Getting his GED. 23-24 school year   Social Determinants of Health   Financial Resource Strain: Not on file  Food Insecurity: Not on file  Transportation Needs: Not on file  Physical Activity: Not on file  Stress: Not on file  Social Connections: Not on file  Intimate Partner Violence: Not on file    Review of Systems Constitutional: Patient denies any  unintentional weight loss or change in strength lntegumentary: Patient denies any rashes or pruritus Eyes: Patient denies dry eyes ENT: Patient denies dry mouth Cardiovascular: Patient denies chest pain or syncope Respiratory: Patient denies shortness of breath Gastrointestinal: Patient denies nausea, vomiting, constipation, or diarrhea Musculoskeletal: Patient denies muscle cramps or weakness Neurologic: Patient denies convulsions or seizures Psychiatric: Patient denies memory problems Allergic/Immunologic: Patient denies recent allergic reaction(s) Hematologic/Lymphatic: Patient denies bleeding tendencies Endocrine: Patient denies heat/cold intolerance  GU: As per HPI.  OBJECTIVE Vitals:   06/02/23 1134  BP: 113/86  Pulse: 92  Temp: 98.6 F (37 C)   There is no height or weight on file to calculate BMI.  Physical Examination  Constitutional: No obvious distress; patient is non-toxic appearing  Cardiovascular: No visible lower extremity edema.  Respiratory: The patient does  not have audible wheezing/stridor; respirations do not appear labored  Gastrointestinal: Abdomen non-distended Musculoskeletal: Normal ROM of UEs  Skin: No obvious rashes/open sores  Neurologic: CN 2-12 grossly intact Psychiatric: Answered questions appropriately with normal affect  Hematologic/Lymphatic/Immunologic: No obvious bruises or sites of spontaneous bleeding  UA: positive for 6-10 WBC/hpf; otherwise no acute findings (0-2 RBC/hpf, no bacteria) PVR: 0 ml  ASSESSMENT Dysuria - Plan: Urinalysis, Routine w reflex microscopic, BLADDER SCAN AMB NON-IMAGING  Female-to-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 - Plan: Urinalysis, Routine w reflex microscopic, BLADDER SCAN AMB NON-IMAGING  Urinary urgency - Plan: Urinalysis, Routine w reflex microscopic, BLADDER SCAN AMB NON-IMAGING  Urinary frequency - Plan: Urinalysis, Routine w reflex microscopic, BLADDER SCAN AMB NON-IMAGING  He is doing better today. We  reviewed past 2 urine culture results, which were negative. Suspect possible atypical bacterial UTI, therefore if UTI symptoms recur will plan to send urine sample for MDX culture. May consider further evaluation such as RUS and/or cystoscopy in the future if indicated.  For UTI prevention we discussed the following options. Handout provided. - Adequate fluid intake (>1.5 liters/day) to flush out the urinary tract. - Go to the bathroom to urinate every 4-6 hours while awake to minimize urinary stasis / bacterial overgrowth in the bladder. - Proanthocyanidin (PAC) supplement 36 mg daily; must be soluble (insoluble form of PAC will be ineffective). Recommend Ellura. D-mannose 2 g daily Vitamin C supplement Probiotic to maintain healthy vaginal microbiome  We discussed the impact hypoestrogenism in terms of the consequences of urogenital epithelial atrophy, particularly in regards to decreased lactobacillus resulting in increased vaginal pH and how that can increase UTI risk. The thinning of the epithelium of the urethra can contribute to urinary urgency and frequency syndromes. He continues to menstruate on current dose of testosterone therefore urogenital epithelial atrophy is considered to be unlikely at this time. As he progresses with his gender transition, may benefit from topical vaginal estrogen replacement at some point in the future if evidence of urogenital epithelial atrophy develops.   For management of acute UTI symptoms: - Patient was advised that if/when UTI-like symptoms occur they can call our office to speak with a nurse, who can place an order for urinalysis (with reflex to urine culture). They may then proceed to a [facility] to provide their urine sample. This is required for evaluation in order for their Urology provider to make informed treatment recommendation(s), which may or may not include an antibiotic prescription. - Discussed option to take over-the-counter  Pyridium (commonly known under the AZO brand name) for bladder pain. No more than 3 days at a time.  Will plan for follow up in 3 months or sooner if needed. Pt verbalized understanding and agreement. All questions were answered.  PLAN Advised the following: 1. Adequate hydration.  2. Continue D-mannose daily. 3. Consider other UTI preventative options as discussed. 4. Return in about 3 months (around 09/02/2023) for UA & f/u with Evette Georges NP.  Orders Placed This Encounter  Procedures   Urinalysis, Routine w reflex microscopic   BLADDER SCAN AMB NON-IMAGING    It has been explained that the patient is to follow regularly with their PCP in addition to all other providers involved in their care and to follow instructions provided by these respective offices. Patient advised to contact urology clinic if any urologic-pertaining questions, concerns, new symptoms or problems arise in the interim period.  Patient Instructions  Recommendations regarding UTI prevention / management:  When UTI symptoms occur: Call  urology office to request order for urine culture. We recommend waiting for urine culture result prior to use of any antibiotics.  For bladder pain/ burning with urination: Over the counter Pyridium (phenazopyridine) as needed (commonly known under the "AZO" brand). No more than 3 days consecutively at a time due to risk for methemoglobinemia, liver function issues, and bone health damage with long term use of Pyridium.  Routine use for UTI prevention: Adequate fluid intake {>1.5 liters/day) to flush out the urinary tract. - Go to the bathroom to urinate every 4-6 hours while awake to minimize urinary stasis / bacterial overgrowth in the bladder. - Proanthocyanidin (PAC) supplement 36 mg daily; must be soluble (insoluble form of PAC will be ineffective). Recommended brand: Ellura. This is an over-the-counter supplement (often must be found/ purchased online) supplement derived from  cranberries with concentrated active component: Proanthocyanidin (PAC) 36 mg daily. Decreases bacterial adherence to bladder lining. Not recommended for patients with interstitial cystitis due to acidity. - D-mannose powder (2 grams daily). This is an over-the-counter supplement which decreases bacterial adherence to bladder lining (it is a sugar that inhibits bacterial adherence to urothelial cells by binding to the pili of enteric bacteria). Take as per manufacturer recommendation. Can be used as an alternative or in addition to the concentrated cranberry supplement. Not recommended for diabetic patients due to sugar content. - Vitamin C supplement to acidify urine to minimize bacterial growth. Not recommended for patients with interstitial cystitis due to acidity. - Probiotic to maintain healthy bladder microbiome / support vaginal health to suppress bacteria. Brand recommendations: Darrold Junker (includes probiotic & D-mannose ), Feminine Balance (highest concentration of lactobacillus), or Hyperbiotic Pro 15.  I spent over 30 minutes either in face-to-face time with this patient or reviewing / documenting in this patient's chart on the date of this visit. Over 50% of that time was spent in direct counseling. E&M based on time and complexity of medical decision making.   Electronically signed by:  Donnita Falls, FNP   06/02/23    12:32 PM

## 2023-06-01 ENCOUNTER — Other Ambulatory Visit: Payer: Self-pay

## 2023-06-01 ENCOUNTER — Encounter (HOSPITAL_BASED_OUTPATIENT_CLINIC_OR_DEPARTMENT_OTHER): Payer: Self-pay | Admitting: Plastic Surgery

## 2023-06-02 ENCOUNTER — Ambulatory Visit (INDEPENDENT_AMBULATORY_CARE_PROVIDER_SITE_OTHER): Payer: Medicaid Other | Admitting: Urology

## 2023-06-02 ENCOUNTER — Encounter: Payer: Self-pay | Admitting: Urology

## 2023-06-02 VITALS — BP 113/86 | HR 92 | Temp 98.6°F

## 2023-06-02 DIAGNOSIS — R3915 Urgency of urination: Secondary | ICD-10-CM | POA: Diagnosis not present

## 2023-06-02 DIAGNOSIS — F640 Transsexualism: Secondary | ICD-10-CM

## 2023-06-02 DIAGNOSIS — R300 Dysuria: Secondary | ICD-10-CM

## 2023-06-02 DIAGNOSIS — R350 Frequency of micturition: Secondary | ICD-10-CM

## 2023-06-02 DIAGNOSIS — Z789 Other specified health status: Secondary | ICD-10-CM

## 2023-06-02 LAB — URINALYSIS, ROUTINE W REFLEX MICROSCOPIC
Bilirubin, UA: NEGATIVE
Glucose, UA: NEGATIVE
Nitrite, UA: NEGATIVE
RBC, UA: NEGATIVE
Specific Gravity, UA: 1.025 (ref 1.005–1.030)
Urobilinogen, Ur: 1 mg/dL (ref 0.2–1.0)
pH, UA: 6.5 (ref 5.0–7.5)

## 2023-06-02 LAB — MICROSCOPIC EXAMINATION
Bacteria, UA: NONE SEEN
Epithelial Cells (non renal): >10 /hpf — AB (ref 0–10)

## 2023-06-02 LAB — BLADDER SCAN AMB NON-IMAGING

## 2023-06-02 NOTE — Patient Instructions (Signed)
Recommendations regarding UTI prevention / management:  When UTI symptoms occur: Call urology office to request order for urine culture. We recommend waiting for urine culture result prior to use of any antibiotics.  For bladder pain/ burning with urination: Over the counter Pyridium (phenazopyridine) as needed (commonly known under the "AZO" brand). No more than 3 days consecutively at a time due to risk for methemoglobinemia, liver function issues, and bone health damage with long term use of Pyridium.  Routine use for UTI prevention: Adequate fluid intake {>1.5 liters/day) to flush out the urinary tract. - Go to the bathroom to urinate every 4-6 hours while awake to minimize urinary stasis / bacterial overgrowth in the bladder. - Proanthocyanidin (PAC) supplement 36 mg daily; must be soluble (insoluble form of PAC will be ineffective). Recommended brand: Ellura. This is an over-the-counter supplement (often must be found/ purchased online) supplement derived from cranberries with concentrated active component: Proanthocyanidin (PAC) 36 mg daily. Decreases bacterial adherence to bladder lining. Not recommended for patients with interstitial cystitis due to acidity. - D-mannose powder (2 grams daily). This is an over-the-counter supplement which decreases bacterial adherence to bladder lining (it is a sugar that inhibits bacterial adherence to urothelial cells by binding to the pili of enteric bacteria). Take as per manufacturer recommendation. Can be used as an alternative or in addition to the concentrated cranberry supplement. Not recommended for diabetic patients due to sugar content. - Vitamin C supplement to acidify urine to minimize bacterial growth. Not recommended for patients with interstitial cystitis due to acidity. - Probiotic to maintain healthy bladder microbiome / support vaginal health to suppress bacteria. Brand recommendations: Darrold Junker (includes probiotic & D-mannose ), Feminine  Balance (highest concentration of lactobacillus), or Hyperbiotic Pro 15.

## 2023-06-05 ENCOUNTER — Encounter: Payer: Self-pay | Admitting: Family Medicine

## 2023-06-09 ENCOUNTER — Other Ambulatory Visit: Payer: Self-pay

## 2023-06-09 ENCOUNTER — Ambulatory Visit (HOSPITAL_BASED_OUTPATIENT_CLINIC_OR_DEPARTMENT_OTHER)
Admission: RE | Admit: 2023-06-09 | Discharge: 2023-06-09 | Disposition: A | Discharge status: Home / Self Care | Payer: Medicaid Other | Attending: Plastic Surgery | Admitting: Plastic Surgery

## 2023-06-09 ENCOUNTER — Encounter (HOSPITAL_BASED_OUTPATIENT_CLINIC_OR_DEPARTMENT_OTHER)
Admission: RE | Disposition: A | Discharge status: Home / Self Care | Payer: Self-pay | Source: Home / Self Care | Attending: Plastic Surgery

## 2023-06-09 ENCOUNTER — Encounter (HOSPITAL_BASED_OUTPATIENT_CLINIC_OR_DEPARTMENT_OTHER): Payer: Self-pay | Admitting: Plastic Surgery

## 2023-06-09 ENCOUNTER — Ambulatory Visit (HOSPITAL_BASED_OUTPATIENT_CLINIC_OR_DEPARTMENT_OTHER): Payer: Medicaid Other | Admitting: Anesthesiology

## 2023-06-09 DIAGNOSIS — F640 Transsexualism: Secondary | ICD-10-CM | POA: Insufficient documentation

## 2023-06-09 DIAGNOSIS — Z803 Family history of malignant neoplasm of breast: Secondary | ICD-10-CM | POA: Insufficient documentation

## 2023-06-09 DIAGNOSIS — Z79899 Other long term (current) drug therapy: Secondary | ICD-10-CM | POA: Diagnosis not present

## 2023-06-09 DIAGNOSIS — F649 Gender identity disorder, unspecified: Secondary | ICD-10-CM

## 2023-06-09 DIAGNOSIS — Z01818 Encounter for other preprocedural examination: Secondary | ICD-10-CM

## 2023-06-09 DIAGNOSIS — F909 Attention-deficit hyperactivity disorder, unspecified type: Secondary | ICD-10-CM

## 2023-06-09 DIAGNOSIS — J45909 Unspecified asthma, uncomplicated: Secondary | ICD-10-CM

## 2023-06-09 HISTORY — PX: AREOLA/NIPPLE RECONSTRUCTION WITH GRAFT: SHX5586

## 2023-06-09 HISTORY — DX: Other specified postprocedural states: R11.2

## 2023-06-09 HISTORY — DX: Other specified postprocedural states: Z98.890

## 2023-06-09 HISTORY — PX: SIMPLE MASTECTOMY WITH AXILLARY SENTINEL NODE BIOPSY: SHX6098

## 2023-06-09 LAB — POCT PREGNANCY, URINE: Preg Test, Ur: NEGATIVE

## 2023-06-09 SURGERY — SIMPLE MASTECTOMY
Anesthesia: General | Site: Breast | Laterality: Bilateral

## 2023-06-09 MED ORDER — LIDOCAINE 2% (20 MG/ML) 5 ML SYRINGE
INTRAMUSCULAR | Status: AC
  Filled 2023-06-09: qty 5

## 2023-06-09 MED ORDER — EPHEDRINE SULFATE (PRESSORS) 50 MG/ML IJ SOLN
INTRAMUSCULAR | Status: DC | PRN
  Administered 2023-06-09: 5 mg via INTRAVENOUS

## 2023-06-09 MED ORDER — OXYCODONE HCL 5 MG/5ML PO SOLN: 5.0000 mg | Freq: Once | ORAL | Status: AC | PRN

## 2023-06-09 MED ORDER — FENTANYL CITRATE (PF) 100 MCG/2ML IJ SOLN
INTRAMUSCULAR | Status: DC | PRN
  Administered 2023-06-09: 25 ug via INTRAVENOUS
  Administered 2023-06-09: 50 ug via INTRAVENOUS
  Administered 2023-06-09 (×4): 25 ug via INTRAVENOUS

## 2023-06-09 MED ORDER — FENTANYL CITRATE (PF) 100 MCG/2ML IJ SOLN
INTRAMUSCULAR | Status: AC
  Filled 2023-06-09: qty 2

## 2023-06-09 MED ORDER — BUPIVACAINE HCL (PF) 0.5 % IJ SOLN
INTRAMUSCULAR | Status: DC | PRN
  Administered 2023-06-09: 30 mL

## 2023-06-09 MED ORDER — LACTATED RINGERS IV SOLN: INTRAVENOUS | Status: DC

## 2023-06-09 MED ORDER — PROPOFOL 10 MG/ML IV BOLUS
INTRAVENOUS | Status: DC | PRN
  Administered 2023-06-09: 200 mg via INTRAVENOUS

## 2023-06-09 MED ORDER — ONDANSETRON HCL 4 MG/2ML IJ SOLN
INTRAMUSCULAR | Status: DC | PRN
  Administered 2023-06-09: 4 mg via INTRAVENOUS

## 2023-06-09 MED ORDER — CELECOXIB 200 MG PO CAPS
ORAL_CAPSULE | ORAL | Status: AC
  Filled 2023-06-09: qty 1

## 2023-06-09 MED ORDER — ONDANSETRON HCL 4 MG PO TABS: 4.0000 mg | ORAL_TABLET | Freq: Three times a day (TID) | ORAL | 0 refills | Status: DC | PRN

## 2023-06-09 MED ORDER — BACITRACIN 500 UNIT/GM EX OINT
TOPICAL_OINTMENT | CUTANEOUS | Status: DC | PRN
  Administered 2023-06-09: 1 via TOPICAL

## 2023-06-09 MED ORDER — EPHEDRINE 5 MG/ML INJ
INTRAVENOUS | Status: AC
  Filled 2023-06-09: qty 5

## 2023-06-09 MED ORDER — 0.9 % SODIUM CHLORIDE (POUR BTL) OPTIME
TOPICAL | Status: DC | PRN
  Administered 2023-06-09: 400 mL

## 2023-06-09 MED ORDER — PROPOFOL 500 MG/50ML IV EMUL
INTRAVENOUS | Status: AC
  Filled 2023-06-09: qty 50

## 2023-06-09 MED ORDER — ACETAMINOPHEN 500 MG PO TABS
1000.0000 mg | ORAL_TABLET | ORAL | Status: AC
  Administered 2023-06-09: 1000 mg via ORAL

## 2023-06-09 MED ORDER — PROPOFOL 10 MG/ML IV BOLUS
INTRAVENOUS | Status: AC
  Filled 2023-06-09: qty 20

## 2023-06-09 MED ORDER — MIDAZOLAM HCL 2 MG/2ML IJ SOLN
INTRAMUSCULAR | Status: AC
  Filled 2023-06-09: qty 2

## 2023-06-09 MED ORDER — FENTANYL CITRATE (PF) 100 MCG/2ML IJ SOLN
25.0000 ug | INTRAMUSCULAR | Status: DC | PRN
  Administered 2023-06-09: 25 ug via INTRAVENOUS

## 2023-06-09 MED ORDER — PROPOFOL 500 MG/50ML IV EMUL
INTRAVENOUS | Status: DC | PRN
  Administered 2023-06-09: 25 ug/kg/min via INTRAVENOUS

## 2023-06-09 MED ORDER — BUPIVACAINE HCL (PF) 0.5 % IJ SOLN
INTRAMUSCULAR | Status: AC
  Filled 2023-06-09: qty 90

## 2023-06-09 MED ORDER — CEFAZOLIN SODIUM-DEXTROSE 2-4 GM/100ML-% IV SOLN
2.0000 g | INTRAVENOUS | Status: AC
  Administered 2023-06-09: 2 g via INTRAVENOUS

## 2023-06-09 MED ORDER — CELECOXIB 200 MG PO CAPS
200.0000 mg | ORAL_CAPSULE | ORAL | Status: AC
  Administered 2023-06-09: 200 mg via ORAL

## 2023-06-09 MED ORDER — OXYCODONE HCL 5 MG PO TABS
ORAL_TABLET | ORAL | Status: AC
  Filled 2023-06-09: qty 1

## 2023-06-09 MED ORDER — OXYCODONE HCL 5 MG PO TABS
5.0000 mg | ORAL_TABLET | Freq: Once | ORAL | Status: AC | PRN
  Administered 2023-06-09: 5 mg via ORAL

## 2023-06-09 MED ORDER — DEXAMETHASONE SODIUM PHOSPHATE 10 MG/ML IJ SOLN
INTRAMUSCULAR | Status: AC
  Filled 2023-06-09: qty 1

## 2023-06-09 MED ORDER — MIDAZOLAM HCL 5 MG/5ML IJ SOLN
INTRAMUSCULAR | Status: DC | PRN
  Administered 2023-06-09: 2 mg via INTRAVENOUS

## 2023-06-09 MED ORDER — ONDANSETRON HCL 4 MG/2ML IJ SOLN
INTRAMUSCULAR | Status: AC
  Filled 2023-06-09: qty 2

## 2023-06-09 MED ORDER — CEFAZOLIN SODIUM-DEXTROSE 2-4 GM/100ML-% IV SOLN
INTRAVENOUS | Status: AC
  Filled 2023-06-09: qty 100

## 2023-06-09 MED ORDER — ONDANSETRON HCL 4 MG/2ML IJ SOLN
4.0000 mg | Freq: Four times a day (QID) | INTRAMUSCULAR | Status: AC | PRN
  Administered 2023-06-09: 4 mg via INTRAVENOUS

## 2023-06-09 MED ORDER — DEXAMETHASONE SODIUM PHOSPHATE 10 MG/ML IJ SOLN
INTRAMUSCULAR | Status: DC | PRN
  Administered 2023-06-09: 10 mg via INTRAVENOUS

## 2023-06-09 MED ORDER — LIDOCAINE HCL (CARDIAC) PF 100 MG/5ML IV SOSY
PREFILLED_SYRINGE | INTRAVENOUS | Status: DC | PRN
  Administered 2023-06-09: 60 mg via INTRATRACHEAL

## 2023-06-09 MED ORDER — CHLORHEXIDINE GLUCONATE CLOTH 2 % EX PADS: 6.0000 | MEDICATED_PAD | Freq: Once | CUTANEOUS | Status: DC

## 2023-06-09 MED ORDER — BACITRACIN ZINC 500 UNIT/GM EX OINT
TOPICAL_OINTMENT | CUTANEOUS | Status: AC
  Filled 2023-06-09: qty 2.7

## 2023-06-09 MED ORDER — ACETAMINOPHEN 500 MG PO TABS
ORAL_TABLET | ORAL | Status: AC
  Filled 2023-06-09: qty 2

## 2023-06-09 SURGICAL SUPPLY — 88 items
ADH SKN CLS APL DERMABOND .7 (GAUZE/BANDAGES/DRESSINGS) ×2
APL PRP STRL LF DISP 70% ISPRP (MISCELLANEOUS) ×2
BALL CTTN LRG ABS STRL LF (GAUZE/BANDAGES/DRESSINGS)
BLADE CLIPPER SURG (BLADE) IMPLANT
BLADE SURG 10 STRL SS (BLADE) ×2 IMPLANT
BLADE SURG 11 STRL SS (BLADE) IMPLANT
BLADE SURG 15 STRL LF DISP TIS (BLADE) ×2 IMPLANT
BLADE SURG 15 STRL SS (BLADE) ×2
BNDG CMPR 5X62 HK CLSR LF (GAUZE/BANDAGES/DRESSINGS) ×1
BNDG CMPR 6"X 5 YARDS HK CLSR (GAUZE/BANDAGES/DRESSINGS) ×1
BNDG ELASTIC 6INX 5YD STR LF (GAUZE/BANDAGES/DRESSINGS) IMPLANT
BRUSH SCRUB EZ PLAIN DRY (MISCELLANEOUS) ×1 IMPLANT
CANISTER SUCT 1200ML W/VALVE (MISCELLANEOUS) ×1 IMPLANT
CHLORAPREP W/TINT 26 (MISCELLANEOUS) ×2 IMPLANT
COTTONBALL LRG STERILE PKG (GAUZE/BANDAGES/DRESSINGS) IMPLANT
COVER BACK TABLE 60X90IN (DRAPES) ×1 IMPLANT
COVER MAYO STAND STRL (DRAPES) ×1 IMPLANT
DERMABOND ADVANCED .7 DNX12 (GAUZE/BANDAGES/DRESSINGS) ×2 IMPLANT
DRAIN CHANNEL 15F RND FF W/TCR (WOUND CARE) IMPLANT
DRAPE TOP ARMCOVERS (MISCELLANEOUS) ×1 IMPLANT
DRAPE U-SHAPE 76X120 STRL (DRAPES) ×1 IMPLANT
DRAPE UTILITY XL STRL (DRAPES) ×1 IMPLANT
DRSG EMULSION OIL 3X3 NADH (GAUZE/BANDAGES/DRESSINGS) ×1 IMPLANT
DRSG TEGADERM 4X10 (GAUZE/BANDAGES/DRESSINGS) IMPLANT
DRSG TEGADERM 4X4.75 (GAUZE/BANDAGES/DRESSINGS) IMPLANT
ELECT COATED BLADE 2.86 ST (ELECTRODE) ×1 IMPLANT
ELECT NDL BLADE 2-5/6 (NEEDLE) ×1 IMPLANT
ELECT NEEDLE BLADE 2-5/6 (NEEDLE) IMPLANT
ELECT REM PT RETURN 9FT ADLT (ELECTROSURGICAL) ×1
ELECTRODE REM PT RTRN 9FT ADLT (ELECTROSURGICAL) ×1 IMPLANT
EVACUATOR SILICONE 100CC (DRAIN) IMPLANT
GAUZE PAD ABD 8X10 STRL (GAUZE/BANDAGES/DRESSINGS) ×2 IMPLANT
GAUZE SPONGE 2X2 STRL 8-PLY (GAUZE/BANDAGES/DRESSINGS) IMPLANT
GAUZE XEROFORM 1X8 LF (GAUZE/BANDAGES/DRESSINGS) IMPLANT
GAUZE XEROFORM 5X9 LF (GAUZE/BANDAGES/DRESSINGS) IMPLANT
GLOVE BIO SURGEON STRL SZ 6 (GLOVE) ×2 IMPLANT
GLOVE BIO SURGEON STRL SZ 6.5 (GLOVE) IMPLANT
GLOVE BIO SURGEON STRL SZ7 (GLOVE) IMPLANT
GLOVE BIO SURGEON STRL SZ8 (GLOVE) IMPLANT
GLOVE BIOGEL PI IND STRL 6.5 (GLOVE) IMPLANT
GLOVE BIOGEL PI IND STRL 7.0 (GLOVE) IMPLANT
GLOVE BIOGEL PI IND STRL 8.5 (GLOVE) IMPLANT
GOWN STRL REUS W/ TWL LRG LVL3 (GOWN DISPOSABLE) ×2 IMPLANT
GOWN STRL REUS W/ TWL XL LVL3 (GOWN DISPOSABLE) IMPLANT
GOWN STRL REUS W/TWL LRG LVL3 (GOWN DISPOSABLE) ×2
GOWN STRL REUS W/TWL XL LVL3 (GOWN DISPOSABLE) ×2
MARKER SKIN DUAL TIP RULER LAB (MISCELLANEOUS) IMPLANT
NDL HYPO 25X1 1.5 SAFETY (NEEDLE) ×1 IMPLANT
NDL HYPO 27GX1-1/4 (NEEDLE) IMPLANT
NDL SAFETY ECLIP 18X1.5 (MISCELLANEOUS) IMPLANT
NEEDLE HYPO 25X1 1.5 SAFETY (NEEDLE) ×1 IMPLANT
NEEDLE HYPO 27GX1-1/4 (NEEDLE) IMPLANT
NS IRRIG 1000ML POUR BTL (IV SOLUTION) ×1 IMPLANT
PACK BASIN DAY SURGERY FS (CUSTOM PROCEDURE TRAY) ×1 IMPLANT
PENCIL SMOKE EVACUATOR (MISCELLANEOUS) ×1 IMPLANT
PIN SAFETY STERILE (MISCELLANEOUS) IMPLANT
SHEET MEDIUM DRAPE 40X70 STRL (DRAPES) ×1 IMPLANT
SLEEVE SCD COMPRESS KNEE MED (STOCKING) ×1 IMPLANT
SPIKE FLUID TRANSFER (MISCELLANEOUS) IMPLANT
SPONGE T-LAP 18X18 ~~LOC~~+RFID (SPONGE) ×2 IMPLANT
STAPLER VISISTAT 35W (STAPLE) ×1 IMPLANT
STRIP CLOSURE SKIN 1/2X4 (GAUZE/BANDAGES/DRESSINGS) IMPLANT
STRIP CLOSURE SKIN 1/4X4 (GAUZE/BANDAGES/DRESSINGS) IMPLANT
SUT CHROMIC 4 0 PS 2 18 (SUTURE) IMPLANT
SUT CHROMIC 5 0 P 3 (SUTURE) IMPLANT
SUT ETHILON 2 0 FS 18 (SUTURE) IMPLANT
SUT MNCRL AB 4-0 PS2 18 (SUTURE) IMPLANT
SUT MON AB 5-0 P3 18 (SUTURE) IMPLANT
SUT PDS AB 2-0 CT2 27 (SUTURE) IMPLANT
SUT PLAIN 5 0 P 3 18 (SUTURE) IMPLANT
SUT PROLENE 5 0 P 3 (SUTURE) IMPLANT
SUT PROLENE 5 0 PS 2 (SUTURE) IMPLANT
SUT PROLENE 6 0 P 1 18 (SUTURE) IMPLANT
SUT SILK 2 0 SH (SUTURE) IMPLANT
SUT SILK 4 0 PS 2 (SUTURE) IMPLANT
SUT VIC AB 3-0 PS1 18 (SUTURE) ×2
SUT VIC AB 3-0 PS1 18XBRD (SUTURE) IMPLANT
SUT VIC AB 4-0 PS2 18 (SUTURE) IMPLANT
SYR 10ML LL (SYRINGE) ×1 IMPLANT
SYR BULB EAR ULCER 3OZ GRN STR (SYRINGE) IMPLANT
SYR BULB IRRIG 60ML STRL (SYRINGE) ×1 IMPLANT
SYR CONTROL 10ML LL (SYRINGE) ×1 IMPLANT
TAPE MEASURE VINYL STERILE (MISCELLANEOUS) IMPLANT
TOWEL GREEN STERILE FF (TOWEL DISPOSABLE) ×2 IMPLANT
TRAY DSU PREP LF (CUSTOM PROCEDURE TRAY) IMPLANT
TUBE CONNECTING 20X1/4 (TUBING) ×1 IMPLANT
UNDERPAD 30X36 HEAVY ABSORB (UNDERPADS AND DIAPERS) ×2 IMPLANT
YANKAUER SUCT BULB TIP NO VENT (SUCTIONS) ×1 IMPLANT

## 2023-06-09 NOTE — Transfer of Care (Signed)
Immediate Anesthesia Transfer of Care Note  Patient: Colleen Lopez  Procedure(s) Performed: SIMPLE MASTECTOMY (Bilateral: Breast) AREOLA/NIPPLE RECONSTRUCTION WITH GRAFT (Bilateral: Breast)  Patient Location: PACU  Anesthesia Type:General  Level of Consciousness: drowsy, patient cooperative, and responds to stimulation  Airway & Oxygen Therapy: Patient Spontanous Breathing and Patient connected to face mask oxygen  Post-op Assessment: Report given to RN and Post -op Vital signs reviewed and stable  Post vital signs: Reviewed and stable  Last Vitals:  Vitals Value Taken Time  BP    Temp    Pulse 99 06/09/23 1025  Resp 17 06/09/23 1025  SpO2 96 % 06/09/23 1025  Vitals shown include unvalidated device data.  Last Pain:  Vitals:   06/09/23 0701  TempSrc: Oral  PainSc: 0-No pain      Patients Stated Pain Goal: 5 (06/09/23 0701)  Complications: No notable events documented.

## 2023-06-09 NOTE — Anesthesia Postprocedure Evaluation (Signed)
Anesthesia Post Note  Patient: Colleen Lopez  Procedure(s) Performed: SIMPLE MASTECTOMY (Bilateral: Breast) AREOLA/NIPPLE RECONSTRUCTION WITH GRAFT (Bilateral: Breast)     Patient location during evaluation: PACU Anesthesia Type: General Level of consciousness: awake and alert Pain management: pain level controlled Vital Signs Assessment: post-procedure vital signs reviewed and stable Respiratory status: spontaneous breathing, nonlabored ventilation, respiratory function stable and patient connected to nasal cannula oxygen Cardiovascular status: blood pressure returned to baseline and stable Postop Assessment: no apparent nausea or vomiting Anesthetic complications: no   No notable events documented.  Last Vitals:  Vitals:   06/09/23 1100 06/09/23 1115  BP: 131/71 132/83  Pulse: 99 (!) 108  Resp: 12 16  Temp:  36.7 C  SpO2: 99% 96%    Last Pain:  Vitals:   06/09/23 1115  TempSrc:   PainSc: 4                  Hoke Baer S

## 2023-06-09 NOTE — Interval H&P Note (Signed)
History and Physical Interval Note:  06/09/2023 6:34 AM  Colleen Lopez  has presented today for surgery, with the diagnosis of gender dysphoria.  The various methods of treatment have been discussed with the patient and family. After consideration of risks, benefits and other options for treatment, the patient has consented to  Procedure(s): SIMPLE MASTECTOMY (Bilateral) AREOLA/NIPPLE RECONSTRUCTION WITH GRAFT (Bilateral) as a surgical intervention.  The patient's history has been reviewed, patient examined, no change in status, stable for surgery.  I have reviewed the patient's chart and labs.  Questions were answered to the patient's satisfaction.     [name]

## 2023-06-09 NOTE — Anesthesia Procedure Notes (Signed)
Procedure Name: LMA Insertion Date/Time: 06/09/2023 7:33 AM  Performed by: Thornell Mule, CRNAPre-anesthesia Checklist: Patient identified, Emergency Drugs available, Suction available and Patient being monitored Patient Re-evaluated:Patient Re-evaluated prior to induction Oxygen Delivery Method: Circle system utilized Preoxygenation: Pre-oxygenation with 100% oxygen Induction Type: IV induction LMA: LMA inserted LMA Size: 4.0 Number of attempts: 1 Placement Confirmation: positive ETCO2 Tube secured with: Tape Dental Injury: Teeth and Oropharynx as per pre-operative assessment

## 2023-06-09 NOTE — Discharge Instructions (Signed)
You may have Tylenol after 1pm today, if needed.   You may have Ibuprofen/NSAIDS again after 3pm today, if needed.  Post Anesthesia Home Care Instructions  Activity: Get plenty of rest for the remainder of the day. A responsible individual must stay with you for 24 hours following the procedure.  For the next 24 hours, DO NOT: -Drive a car -Advertising copywriter -Drink alcoholic beverages -Take any medication unless instructed by your physician -Make any legal decisions or sign important papers.  Meals: Start with liquid foods such as gelatin or soup. Progress to regular foods as tolerated. Avoid greasy, spicy, heavy foods. If nausea and/or vomiting occur, drink only clear liquids until the nausea and/or vomiting subsides. Call your physician if vomiting continues.  Special Instructions/Symptoms: Your throat may feel dry or sore from the anesthesia or the breathing tube placed in your throat during surgery. If this causes discomfort, gargle with warm salt water. The discomfort should disappear within 24 hours.  If you had a scopolamine patch placed behind your ear for the management of post- operative nausea and/or vomiting:  1. The medication in the patch is effective for 72 hours, after which it should be removed.  Wrap patch in a tissue and discard in the trash. Wash hands thoroughly with soap and water. 2. You may remove the patch earlier than 72 hours if you experience unpleasant side effects which may include dry mouth, dizziness or visual disturbances. 3. Avoid touching the patch. Wash your hands with soap and water after contact with the patch.   About my Jackson-Pratt Bulb Drain  What is a Jackson-Pratt bulb? A Jackson-Pratt is a soft, round device used to collect drainage. It is connected to a long, thin drainage catheter, which is held in place by one or two small stiches near your surgical incision site. When the bulb is squeezed, it forms a vacuum, forcing the drainage to empty  into the bulb.  Emptying the Jackson-Pratt bulb- To empty the bulb: 1. Release the plug on the top of the bulb. 2. Pour the bulb's contents into a measuring container which your nurse will provide. 3. Record the time emptied and amount of drainage. Empty the drain(s) as often as your     doctor or nurse recommends.  Date                  Time                    Amount (Drain 1)                 Amount (Drain 2)  _____________________________________________________________________  _____________________________________________________________________  _____________________________________________________________________  _____________________________________________________________________  _____________________________________________________________________  _____________________________________________________________________  _____________________________________________________________________  _____________________________________________________________________  Squeezing the Jackson-Pratt Bulb- To squeeze the bulb: 1. Make sure the plug at the top of the bulb is open. 2. Squeeze the bulb tightly in your fist. You will hear air squeezing from the bulb. 3. Replace the plug while the bulb is squeezed. 4. Use a safety pin to attach the bulb to your clothing. This will keep the catheter from     pulling at the bulb insertion site.  When to call your doctor- Call your doctor if: Drain site becomes red, swollen or hot. You have a fever greater than 101 degrees F. There is oozing at the drain site. Drain falls out (apply a guaze bandage over the drain hole and secure it with tape). Drainage increases daily not related to activity patterns. (You will usually  have more drainage when you are active than when you are resting.) Drainage has a bad odor.

## 2023-06-09 NOTE — Op Note (Signed)
Operative Note   DATE OF OPERATION: 6.21.2024  LOCATION: [facility]-outpatient  SURGICAL DIVISION: Plastic Surgery  PREOPERATIVE DIAGNOSES:  1. Gender dysphoria  POSTOPERATIVE DIAGNOSES:  same  PROCEDURE:  Bilateral mastectomies with free nipple grafts  SURGEON: Glenna Fellows MD MBA  ASSISTANT: none  ANESTHESIA:  General.   EBL: 30 ml  COMPLICATIONS: None immediate.   INDICATIONS FOR PROCEDURE:  The patient, Colleen Lopez, is a 17 y.o. adult born on 10-21-2006, is here for gender affirming chest surgery   FINDINGS: bilateral simple mastectomies completed  DESCRIPTION OF PROCEDURE:  The patient's operative site was marked with the patient in the preoperative area. Patient marked in standing position for anticipated skin resection. The patient was taken to the operating room. SCDs were placed and IV antibiotics were given. The patient's operative site was prepped and draped in a sterile fashion. A time out was performed and all information was confirmed to be correct. Transverse elliptical excision drawn over bilateral breast with lower border superior to inframammary fold to include NAC.   I began on left breast. The nipple areolar complex marked with 25 mm diameter. The NAC was incised and harvested as full thickness graft. The graft was placed in saline guaze. Remainder incisions completed and subcutaneous dissection completed to all clinical extents of breast. Inferiorly dissection completed to ensure disruption inframammary fold. The breast was then dissected off chest wall taking care to leave pectoralis fascia intact. The cavity was irrigated and hemostasis obtained. Local anesthetic infiltrated. 15 Fr JP placed and secured with 2-0 nylon. Progressive tension sutures placed with 2-0 PDS to advance the mastectomy flap inferiorly. IMF incision closed with interrupted 2-0 PDS suture as 3 point suture of superficial fascia of flaps to chest wall. 3-0 vicryl placed in dermis  followed by 4-0 monocyl subcuticular skin closure.    I then directed attention to right breast. Similar full thickness NAC graft harvested. Mastectomy completed and drain placed. Local anesthetic infiltrated. Closure completed in similar fashion following placement of progressive tension sutures. Tissue adhesive applied to IMF incisions.   Location for NAC grafts marked symmetrically over chest, 10.5 cm from midline at pectoralis border. Areas for inset grafts were deepithelialized. Grafts prepared by excision of breast tissue from undersurface. The grafts were inset with 5-0 plain gut suture. Antibiotic ointment applied followed by adaptic and sponge bolster dressing secured with 4-0 silk. Dry dressing and Ace wrap applied   The patient was allowed to wake from anesthesia, extubated and taken to the recovery room in satisfactory condition.   SPECIMENS: right and left mastectomy  DRAINS: 15 Fr JP in right and left subcutaneous chest  Glenna Fellows, MD [facility]  Office/ physician access line after hours [phone]

## 2023-06-09 NOTE — Anesthesia Preprocedure Evaluation (Signed)
Anesthesia Evaluation  Patient identified by MRN, date of birth, ID band Patient awake    Reviewed: Allergy & Precautions, H&P , NPO status , Patient's Chart, lab work & pertinent test results  History of Anesthesia Complications (+) PONV and history of anesthetic complications  Airway Mallampati: II   Neck ROM: full    Dental   Pulmonary asthma    breath sounds clear to auscultation       Cardiovascular negative cardio ROS  Rhythm:regular Rate:Normal     Neuro/Psych        ADHD   GI/Hepatic   Endo/Other    Renal/GU      Musculoskeletal   Abdominal   Peds  Hematology   Anesthesia Other Findings   Reproductive/Obstetrics                             Anesthesia Physical Anesthesia Plan  ASA: 2  Anesthesia Plan: General   Post-op Pain Management:    Induction: Intravenous  PONV Risk Score and Plan: 4 or greater and Ondansetron, Dexamethasone, Midazolam and Treatment may vary due to age or medical condition  Airway Management Planned: Oral ETT  Additional Equipment:   Intra-op Plan:   Post-operative Plan: Extubation in OR  Informed Consent: I have reviewed the patients History and Physical, chart, labs and discussed the procedure including the risks, benefits and alternatives for the proposed anesthesia with the patient or authorized representative who has indicated his/her understanding and acceptance.       Plan Discussed with: CRNA, Anesthesiologist and Surgeon  Anesthesia Plan Comments:        Anesthesia Quick Evaluation

## 2023-06-12 ENCOUNTER — Encounter (HOSPITAL_BASED_OUTPATIENT_CLINIC_OR_DEPARTMENT_OTHER): Payer: Self-pay | Admitting: Plastic Surgery

## 2023-06-12 LAB — SURGICAL PATHOLOGY

## 2023-06-15 ENCOUNTER — Ambulatory Visit: Payer: Medicaid Other | Admitting: Urology

## 2023-06-21 ENCOUNTER — Ambulatory Visit (INDEPENDENT_AMBULATORY_CARE_PROVIDER_SITE_OTHER): Payer: MEDICAID | Admitting: *Deleted

## 2023-06-21 DIAGNOSIS — Z3042 Encounter for surveillance of injectable contraceptive: Secondary | ICD-10-CM

## 2023-06-21 MED ORDER — MEDROXYPROGESTERONE ACETATE 150 MG/ML IM SUSY
150.0000 mg | PREFILLED_SYRINGE | Freq: Once | INTRAMUSCULAR | Status: AC
  Administered 2023-06-21: 150 mg via INTRAMUSCULAR

## 2023-06-21 NOTE — Progress Notes (Signed)
   NURSE VISIT- INJECTION  SUBJECTIVE:  Colleen Lopez is a 17 y.o. No obstetric history on file. female here for a Depo Provera for contraception/period management. She is a GYN patient.   OBJECTIVE:  LMP 06/01/2023 (Exact Date) Comment: UPT neg DOS  Appears well, in no apparent distress  Injection administered in: Left upper quad. gluteus  Meds ordered this encounter  Medications   medroxyPROGESTERone Acetate SUSY 150 mg    ASSESSMENT: GYN patient Depo Provera for contraception/period management PLAN: Follow-up: in 11-13 weeks for next Depo   Annamarie Dawley  06/21/2023 1:48 PM

## 2023-06-23 ENCOUNTER — Encounter (INDEPENDENT_AMBULATORY_CARE_PROVIDER_SITE_OTHER): Payer: Self-pay

## 2023-07-04 ENCOUNTER — Ambulatory Visit (INDEPENDENT_AMBULATORY_CARE_PROVIDER_SITE_OTHER): Payer: MEDICAID | Admitting: Pediatric Endocrinology

## 2023-07-04 ENCOUNTER — Encounter (INDEPENDENT_AMBULATORY_CARE_PROVIDER_SITE_OTHER): Payer: Self-pay | Admitting: Pediatric Endocrinology

## 2023-07-04 VITALS — BP 112/86 | Ht 65.0 in | Wt 154.0 lb

## 2023-07-04 DIAGNOSIS — F649 Gender identity disorder, unspecified: Secondary | ICD-10-CM

## 2023-07-04 DIAGNOSIS — E349 Endocrine disorder, unspecified: Secondary | ICD-10-CM | POA: Diagnosis not present

## 2023-07-04 DIAGNOSIS — F32A Depression, unspecified: Secondary | ICD-10-CM

## 2023-07-04 DIAGNOSIS — F419 Anxiety disorder, unspecified: Secondary | ICD-10-CM

## 2023-07-04 NOTE — Progress Notes (Signed)
Pediatric Endocrinology Consultation Follow up Visit  Colleen Lopez 03/13/2006 [id number]  Preferred Name: Colleen Lopez Preferred Pronouns: he/him  HPI: Colleen Lopez is a 17 y.o. assigned female at birth presenting for follow up of gender affirming care. 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 He established care 03/23/2021, and received his first Lupron depot 30mg  injection 04/21/2021, and skipped August 2022 dose. He is accompanied to this visit by his grandmother who is his legal guardian. His grandfather lives in a separate household, and shares medical decision making though he does not attend medical appointments. There are multiple family members who do not know about Colleen Lopez.  2. Colleen Lopez was last seen in pediatric endocrinology on 01/03/23. In the interim he has been doing ok.  He had his top surgery on 06/09/23. He is still recovering and still has glue on his incisions. He is very happy that he has the drains out. He also feels really good about not needing to bind anymore.   He is still living with dad.   He has questions about options for bottom surgery including urethral lengthening. He is concerned about the "lack" of "bottom growth" that he has experienced thus far. At last visit he discussed a desire to try DHT. Discussed today that DHT is not available in the [name] for therapeutic use. Will check his DHT level and consider management for low 5 alpha reductase if indicated.   He has questions again about his history of premature adrenarche, concerns for PCOS, and concerns about possible oophorectomy/hysterotomy in the future. However, he is currently battling recurrent UTI and would prefer to get that better controlled first.   He is taking 50 mg of testosterone every week. No issues with injection.    Body Goals: unchanged -flatter chest -deeper voice -more masculine body structure -more facial hair and other masculine hair growth  3. ROS: Greater than 10  systems reviewed with pertinent positives listed in HPI, otherwise neg.  Past Medical History:   Past Medical History:  Diagnosis Date   Abdominal pain    ADHD (attention deficit hyperactivity disorder)    Asthma    Pervasive developmental disorder    PONV (postoperative nausea and vomiting)    Vision abnormalities    Vomiting   Initial history: When he was 80-56 years old he felt that he was "dysphoric" and fought with his gender identity.  He has always preferred masculine things.  He hated being called anything feminine or being forced to present as female.  He knows that he is not a tomboy. In 5-6th grade he discovered the term "transgender" and feels that best represents him.  Before high school and during the pandemic he "came out."  He socially transitioned 2 years ago.  He cut his hair and school system changed his name at age 18. He is binding sometimes.    He is seeing a psychiatrist, Dr. Diannia Ruder. This provider was reportedly uncomfortable with making the diagnosis of gender dysphoria.  He is also seeing a dermatologist for acne.  Meds: Outpatient Encounter Medications as of 07/04/2023  Medication Sig   albuterol (PROAIR HFA) 108 (90 Base) MCG/ACT inhaler Inhale 2 puffs into the lungs every 4 (four) hours as needed for wheezing or shortness of breath.   fluticasone (FLONASE) 50 MCG/ACT nasal spray Place 2 sprays into both nostrils daily.   ISOtretinoin (ACCUTANE) 40 MG capsule Take 80 mg by mouth 2 (two) times daily.   methylphenidate (RITALIN) 10 MG tablet Take 1 tablet (  10 mg total) by mouth 2 (two) times daily with breakfast and lunch.   testosterone cypionate (DEPOTESTOSTERONE CYPIONATE) 200 MG/ML injection Inject 0.25 mLs (50 mg total) into the skin every 7 (seven) days.   Adapalene-Benzoyl Peroxide 0.3-2.5 % GEL Apply topically. (Patient not taking: Reported on 07/04/2023)   fluticasone (FLOVENT HFA) 110 MCG/ACT inhaler Inhale 2 puffs into the lungs 2 (two) times daily.  (Patient not taking: Reported on 07/04/2023)   hydrOXYzine (VISTARIL) 25 MG capsule Take 1 capsule (25 mg total) by mouth at bedtime. (Patient not taking: Reported on 07/04/2023)   Insulin Syringe-Needle U-100 (INSULIN SYRINGE .5CC/28G) 28G X 1/2" 0.5 ML MISC Use as directed with testosterone (Patient not taking: Reported on 07/04/2023)   lidocaine-prilocaine (EMLA) cream Use as directed (Patient not taking: Reported on 07/04/2023)   norethindrone (AYGESTIN) 5 MG tablet Take 1-3 tablets (5-15 mg total) by mouth daily. Take 3 tabs daily for menstrual bleeding (until bleeding stops/slows). Take 2 pills daily for spotting. Take 1 pill daily baseline. Do not take Melatonin with this medication. (Patient not taking: Reported on 07/04/2023)   ondansetron (ZOFRAN) 4 MG tablet Take 1 tablet (4 mg total) by mouth every 8 (eight) hours as needed for nausea or vomiting. (Patient not taking: Reported on 07/04/2023)   Spacer/Aero-Holding Chambers DEVI 1 applicator by Does not apply route as needed. (Patient not taking: Reported on 07/04/2023)   [DISCONTINUED] sulfamethoxazole-trimethoprim (BACTRIM DS) 800-160 MG tablet Take 1 tablet by mouth 2 (two) times daily.   No facility-administered encounter medications on file as of 07/04/2023.    Allergies: No Known Allergies  Surgical History: Past Surgical History:  Procedure Laterality Date   AREOLA/NIPPLE RECONSTRUCTION WITH GRAFT Bilateral 06/09/2023   Procedure: AREOLA/NIPPLE RECONSTRUCTION WITH GRAFT;  Surgeon: Glenna Fellows, MD;  Location: [facility];  Service: Plastics;  Laterality: Bilateral;   septorhinoplasty  07/2020   SIMPLE MASTECTOMY WITH AXILLARY SENTINEL NODE BIOPSY Bilateral 06/09/2023   Procedure: SIMPLE MASTECTOMY;  Surgeon: Glenna Fellows, MD;  Location:  SURGERY CENTER;  Service: Plastics;  Laterality: Bilateral;     Family History:  Family History  Problem Relation Age of Onset   Obesity Maternal Grandmother     Diabetes type II Maternal Grandmother    Hypertension Maternal Grandmother    COPD Maternal Grandmother    Hyperlipidemia Maternal Grandmother    Heart disease Maternal Grandmother    Aneurysm Maternal Grandfather    Early death Maternal Grandfather    Bipolar disorder Mother    Drug abuse Mother    Drug abuse Father    Alcohol abuse Father    ADD / ADHD Brother    Bipolar disorder Paternal Uncle    Migraines Neg Hx     Social History: Social History   Social History Narrative   ** Merged History Encounter **       Lives at home with grandmother and step grandfather, aunt and two half brothers. 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      Lives with [name] and 2 half brothers.    He is going to work towards getting GED   He enjoys playing bass, (Psychiatric nurse - guitar)  drawing, and fantasy world building.       Getting his GED. 24-25 school year  In a relationship with a trans-female. Non- penetrative intercourse  Physical Exam:  Vitals:   07/04/23 1439  BP: 112/86  Weight: 154 lb (  69.9 kg)  Height: 5\' 5"  (1.651 m)   BP 112/86   Ht 5\' 5"  (1.651 m)   Wt 154 lb (69.9 kg)   LMP 06/01/2023 (Exact Date) Comment: UPT neg DOS  BMI 25.63 kg/m  Body mass index: body mass index is 25.63 kg/m. Blood pressure %iles are not available for patients who are 18 years or older.  Wt Readings from Last 3 Encounters:  07/04/23 154 lb (69.9 kg) (85%, Z= 1.05)*  06/09/23 155 lb 13.8 oz (70.7 kg) (87%, Z= 1.10)*  04/26/23 160 lb (72.6 kg) (89%, Z= 1.22)*   * Growth percentiles are based on CDC (Girls, 2-20 Years) data.   Ht Readings from Last 3 Encounters:  07/04/23 5\' 5"  (1.651 m) (61%, Z= 0.29)*  06/09/23 5\' 5"  (1.651 m) (61%, Z= 0.29)*  04/26/23 5\' 5"  (1.651 m) (61%, Z= 0.29)*   * Growth percentiles are based on CDC (Girls, 2-20 Years) data.   Weight is basically stable.    Physical Exam Vitals reviewed.   Constitutional:      Appearance: Normal appearance.  HENT:     Head: Normocephalic and atraumatic.     Nose: Nose normal.     Mouth/Throat:     Mouth: Mucous membranes are moist.  Eyes:     Extraocular Movements: Extraocular movements intact.     Comments: glasses  Cardiovascular:     Pulses: Normal pulses.  Pulmonary:     Effort: Pulmonary effort is normal. No respiratory distress.  Abdominal:     General: There is no distension.  Musculoskeletal:        General: Normal range of motion.     Cervical back: Normal range of motion and neck supple.  Skin:    General: Skin is warm.     Capillary Refill: Capillary refill takes less than 2 seconds.     Findings: No rash.  Neurological:     General: No focal deficit present.     Mental Status: He is alert.     Gait: Gait normal.  Psychiatric:        Mood and Affect: Mood normal.        Behavior: Behavior normal.      Labs: Results for orders placed or performed during the hospital encounter of 06/09/23  Pregnancy, urine POC  Result Value Ref Range   Preg Test, Ur NEGATIVE NEGATIVE  Surgical pathology  Result Value Ref Range   SURGICAL PATHOLOGY      SURGICAL PATHOLOGY CASE: [id number] [phone] PATIENT: Colleen Lopez Surgical Pathology Report     Clinical History: gender dysphoria (cm)     FINAL MICROSCOPIC DIAGNOSIS:  A. BREAST, LEFT, MASTECTOMY: - Benign breast tissue with no significant pathologic changes - No malignancy identified  B. BREAST, RIGHT, MASTECTOMY: - Benign breast tissue with no significant pathologic changes - No malignancy identified    GROSS DESCRIPTION:  A. Specimen: received fresh and subsequently placed in formalin labeled with the patient's name and "Left breast tissue" is a simple mastectomy.  Specimen integrity: intact Weight: 445 g Size: 17.0 cm medial-lateral, 18.8 cm superior-inferior, and 6.9 cm anterior-posterior. Skin: horizontally oriented on the anterior surface is a  17.3 x 17.6 cm light tan, etc. skin ellipse with a 3.0 cm areola, absent the central nipple. Parenchyma: approximately 50% adipose tissue and 50% fibrous tissue without a discrete mass or lesion. Lymph  nodes: none grossly identified. Block summary: A1. upper outer quadrant A2: upper inner quadrant A3: lower inner quadrant A4: lower outer quadrant  B. Specimen: received fresh and subsequently placed in formalin labeled with the patient's name and "Right breast tissue" is a simple mastectomy. Specimen integrity: intact Weight: 362 g Size: 16.7 cm medial-lateral, 17.8 cm superior-inferior, and 6.2 cm anterior-posterior. Skin: horizontally oriented on the anterior surface is a 16.3 x 9.7 cm light tan skin ellipse with a 3.4 cm areola, absent the central nipple. Parenchyma: approximately 50% adipose tissue and 50% fibrous tissue without a discrete mass or lesion. Lymph nodes: none grossly identified. Block summary: B1: upper inner quadrant B2: upper outer quadrant B3: lower outer quadrant B4: lower inner quadrant  (LEF 06/09/2023)   Final Diagnosis performed by Clifton James, MD.   Electronically signed 06/12/2023 Technical component performed at [employer]. [facility], [address], [place], [place] [address].  Professional component performed at [employer] [facility], [address] [place], [place] [address].  Immunohistochemistry Technical component (if applicable) was performed at [facility]. [address], [place], [place] [address].   IMMUNOHISTOCHEMISTRY DISCLAIMER (if applicable): Some of these immunohistochemical stains may have been developed and the performance characteristics determine by [facility]. Some may not have been cleared or approved by the U.S. Food and Drug Administration. The FDA has determined that such clearance or approval is not necessary. This test is used for clinical purposes. It  should not be regarded as investigational or for research. This laboratory is certified under the Clinical Laboratory Improvement Amendments of 1988 (CLIA-88) as qualified to perform high complexity clinical laboratory testing.  The controls stained approp riately.   IHC stains are performed on formalin fixed, paraffin embedded tissue using a 3,3"diaminobenzidine (DAB) chromogen and Leica Bond Autostainer System. The staining intensity of the nucleus is score manually and is reported as the percentage of tumor cell nuclei demonstrating specific nuclear staining. The specimens are fixed in 10% Neutral Formalin for at least 6 hours and up to 72hrs. These tests are validated on decalcified tissue. Results should be interpreted with caution given the possibility of false negative results on decalcified specimens. Antibody Clones are as follows ER-clone [id number], PR-clone 16, Ki67- clone MM1. Some of these immunohistochemical stains may have been developed and the performance characteristics determined by [facility].     Assessment/Plan: Colleen Lopez is a 17 y.o. 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   He has been receiving GnRH agonist therapy with Boris Lown  He is interested in continuing on [facility] for now. He is anxious that with Norethindrone or Depo Provera he will have issues with dysregulated menstrual bleeding.   He has been on testosterone 50 mg q week since April 2024 (previously on 40 mg weekly)  Endocrine disorder related to puberty  - Increase Testosterone 50 mg weekly (25 units on insulin syringe) - Labs today - Questions about surgical options -Referral placed to the Gender Clinic at [facility] with Dr. Jennette Kettle.    Orders Placed This Encounter  Procedures   CBC   Comprehensive metabolic panel   Estradiol   Testosterone, Total, LC/MS/MS    Dihydrotestosterone    Standing Status:   Future    Standing Expiration Date:   07/03/2024   Ambulatory referral to [facility]    Referral Priority:   Routine    Referral Type:   Consultation    Referral Reason:   Specialty Services Required    Referred to Provider:  Nestor Ramp, MD    Requested Specialty:   Family Medicine    Number of Visits Requested:   1     Follow-up:  Follow up with Dr. Jennette Kettle at [facility]    Medical decision-making:   >40 minutes spent today reviewing the medical chart, counseling the patient/family, and documenting today's encounter.   Thank you for the opportunity to participate in the care of your patient. Please do not hesitate to contact me should you have any questions regarding the assessment or treatment plan.   Sincerely,   Dessa Phi, MD

## 2023-07-08 LAB — COMPREHENSIVE METABOLIC PANEL
AG Ratio: 1.9 (calc) (ref 1.0–2.5)
ALT: 12 U/L (ref 5–32)
AST: 13 U/L (ref 12–32)
Albumin: 5 g/dL (ref 3.6–5.1)
Alkaline phosphatase (APISO): 83 U/L (ref 36–128)
BUN: 8 mg/dL (ref 7–20)
CO2: 21 mmol/L (ref 20–32)
Calcium: 10.1 mg/dL (ref 8.9–10.4)
Chloride: 106 mmol/L (ref 98–110)
Creat: 0.9 mg/dL (ref 0.50–0.96)
Globulin: 2.7 g/dL (calc) (ref 2.0–3.8)
Glucose, Bld: 83 mg/dL (ref 65–139)
Potassium: 4.2 mmol/L (ref 3.8–5.1)
Sodium: 140 mmol/L (ref 135–146)
Total Bilirubin: 0.7 mg/dL (ref 0.2–1.1)
Total Protein: 7.7 g/dL (ref 6.3–8.2)

## 2023-07-08 LAB — CBC
HCT: 47.7 % — ABNORMAL HIGH (ref 34.0–46.0)
Hemoglobin: 16.5 g/dL — ABNORMAL HIGH (ref 11.5–15.3)
MCH: 30.3 pg (ref 25.0–35.0)
MCHC: 34.6 g/dL (ref 31.0–36.0)
MCV: 87.5 fL (ref 78.0–98.0)
MPV: 11 fL (ref 7.5–12.5)
Platelets: 320 10*3/uL (ref 140–400)
RBC: 5.45 10*6/uL — ABNORMAL HIGH (ref 3.80–5.10)
RDW: 12.1 % (ref 11.0–15.0)
WBC: 7.3 10*3/uL (ref 4.5–13.0)

## 2023-07-08 LAB — TESTOSTERONE, TOTAL, LC/MS/MS: Testosterone, Total, LC-MS-MS: 564 ng/dL — ABNORMAL HIGH (ref 2–45)

## 2023-07-08 LAB — ESTRADIOL: Estradiol: 34 pg/mL

## 2023-07-13 ENCOUNTER — Encounter (INDEPENDENT_AMBULATORY_CARE_PROVIDER_SITE_OTHER): Payer: Self-pay | Admitting: Pediatric Endocrinology

## 2023-07-19 ENCOUNTER — Ambulatory Visit: Payer: MEDICAID | Admitting: Obstetrics & Gynecology

## 2023-07-29 ENCOUNTER — Encounter: Payer: Self-pay | Admitting: Family Medicine

## 2023-08-03 ENCOUNTER — Other Ambulatory Visit: Payer: Self-pay | Admitting: Family Medicine

## 2023-08-03 DIAGNOSIS — J4520 Mild intermittent asthma, uncomplicated: Secondary | ICD-10-CM

## 2023-08-03 MED ORDER — ALBUTEROL SULFATE HFA 108 (90 BASE) MCG/ACT IN AERS
2.0000 | INHALATION_SPRAY | RESPIRATORY_TRACT | 1 refills | Status: DC | PRN
Start: 2023-08-03 — End: 2023-11-09

## 2023-08-14 ENCOUNTER — Other Ambulatory Visit (INDEPENDENT_AMBULATORY_CARE_PROVIDER_SITE_OTHER): Payer: Self-pay | Admitting: Pediatric Endocrinology

## 2023-08-15 MED ORDER — TESTOSTERONE CYPIONATE 200 MG/ML IM SOLN: 50.0000 mg | INTRAMUSCULAR | 0 refills | Status: DC

## 2023-08-21 ENCOUNTER — Other Ambulatory Visit (INDEPENDENT_AMBULATORY_CARE_PROVIDER_SITE_OTHER): Payer: Self-pay | Admitting: Pediatric Endocrinology

## 2023-08-28 NOTE — Progress Notes (Unsigned)
Name: Colleen Lopez DOB: 09/28/2006 MRN: [id number]  History of Present Illness: Mr. Raysor is a 17 y.o. adult who presents today for follow up visit at [facility] [place]. 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  - Follows with Endocrinology (Dr. Vanessa [place]) for gender affirming care. Per last visit note, referral place to Dr. Guy Sandifer at [facility] to discuss bottom surgery.  - Follows with Behavioral Health (Dr. Tenny Craw). - Takes hormone therapy including testosterone and Lupron. - He continues to menstruate on current dose of testosterone as of 06/02/2023.  GU history:  1. LUTS (dysuria, frequency, urgency) x1 year in the setting of negative urine cultures.  - No past history of kidney stones. 2. Microscopic hematuria. - 05/23/2023: Urine microscopy showed 6-10 WBC/hpf, >30 RBC/hpf, and few bacteria. Urine culture was negative.  - Minimal risk factors for malignancy.  At last visit on 06/02/2023: - Symptoms were much improved after antibiotic treatment as per Dr. Retta Diones on 05/23/2023 (Bactrim).  - Suspect possible atypical bacterial UTI.  - The plan was:  1. Adequate hydration.  2. Continue D-mannose daily. 3. Consider other UTI preventative options as discussed. 4. Follow up in 3 months. If UTI symptoms recur will plan to send urine sample for MDX culture. May consider further evaluation such as RUS and/or cystoscopy in the future if indicated. ***  Since last visit: 06/09/2023: Underwent bilateral simple mastectomy with Dr. Leta Baptist (plastic surgery).  Today: He reports ***  He {Actions; denies-reports:120008} increased urinary urgency, frequency, nocturia, dysuria, gross hematuria, hesitancy, straining to void, or sensations of incomplete emptying.   Fall Screening: Do you usually have a device to assist in your mobility? {yes/no:20286} ***cane / ***walker / ***wheelchair   Medications: Current Outpatient Medications  Medication Sig Dispense Refill   Adapalene-Benzoyl  Peroxide 0.3-2.5 % GEL Apply topically. (Patient not taking: Reported on 07/04/2023)     albuterol (PROAIR HFA) 108 (90 Base) MCG/ACT inhaler Inhale 2 puffs into the lungs every 4 (four) hours as needed for wheezing or shortness of breath. 18 g 1   fluticasone (FLONASE) 50 MCG/ACT nasal spray Place 2 sprays into both nostrils daily. 16 g 5   fluticasone (FLOVENT HFA) 110 MCG/ACT inhaler Inhale 2 puffs into the lungs 2 (two) times daily. (Patient not taking: Reported on 07/04/2023) 1 each 5   hydrOXYzine (VISTARIL) 25 MG capsule Take 1 capsule (25 mg total) by mouth at bedtime. (Patient not taking: Reported on 07/04/2023) 90 capsule 1   Insulin Syringe-Needle U-100 (INSULIN SYRINGE .5CC/28G) 28G X 1/2" 0.5 ML MISC Use as directed with testosterone (Patient not taking: Reported on 07/04/2023) 10 each 5   ISOtretinoin (ACCUTANE) 40 MG capsule Take 80 mg by mouth 2 (two) times daily.     lidocaine-prilocaine (EMLA) cream Use as directed (Patient not taking: Reported on 07/04/2023) 30 g 0   methylphenidate (RITALIN) 10 MG tablet Take 1 tablet (10 mg total) by mouth 2 (two) times daily with breakfast and lunch. 60 tablet 0   norethindrone (AYGESTIN) 5 MG tablet Take 1-3 tablets (5-15 mg total) by mouth daily. Take 3 tabs daily for menstrual bleeding (until bleeding stops/slows). Take 2 pills daily for spotting. Take 1 pill daily baseline. Do not take Melatonin with this medication. (Patient not taking: Reported on 07/04/2023) 60 tablet 3   ondansetron (ZOFRAN) 4 MG tablet Take 1 tablet (4 mg total) by mouth every 8 (eight) hours as needed for nausea or vomiting. (Patient not taking: Reported on 07/04/2023) 10 tablet 0   Spacer/Aero-Holding  Chambers DEVI 1 applicator by Does not apply route as needed. (Patient not taking: Reported on 07/04/2023) 1 application 0   testosterone cypionate (DEPOTESTOSTERONE CYPIONATE) 200 MG/ML injection Inject 0.25 mLs (50 mg total) into the skin every 7 (seven) days. 4 mL 0   No  current facility-administered medications for this visit.    Allergies: No Known Allergies  Past Medical History:  Diagnosis Date   Abdominal pain    ADHD (attention deficit hyperactivity disorder)    Asthma    Pervasive developmental disorder    PONV (postoperative nausea and vomiting)    Vision abnormalities    Vomiting    Past Surgical History:  Procedure Laterality Date   AREOLA/NIPPLE RECONSTRUCTION WITH GRAFT Bilateral 06/09/2023   Procedure: AREOLA/NIPPLE RECONSTRUCTION WITH GRAFT;  Surgeon: Glenna Fellows, MD;  Location: [facility];  Service: Plastics;  Laterality: Bilateral;   septorhinoplasty  07/2020   SIMPLE MASTECTOMY WITH AXILLARY SENTINEL NODE BIOPSY Bilateral 06/09/2023   Procedure: SIMPLE MASTECTOMY;  Surgeon: Glenna Fellows, MD;  Location: [facility];  Service: Plastics;  Laterality: Bilateral;   Family History  Problem Relation Age of Onset   Obesity Maternal Grandmother    Diabetes type II Maternal Grandmother    Hypertension Maternal Grandmother    COPD Maternal Grandmother    Hyperlipidemia Maternal Grandmother    Heart disease Maternal Grandmother    Aneurysm Maternal Grandfather    Early death Maternal Grandfather    Bipolar disorder Mother    Drug abuse Mother    Drug abuse Father    Alcohol abuse Father    ADD / ADHD Brother    Bipolar disorder Paternal Uncle    Migraines Neg Hx    Social History   Socioeconomic History   Marital status: Significant Other    Spouse name: Not on file   Number of children: Not on file   Years of education: Not on file   Highest education level: Not on file  Occupational History   Not on file  Tobacco Use   Smoking status: Never    Passive exposure: Yes   Smokeless tobacco: Never  Vaping Use   Vaping status: Never Used  Substance and Sexual Activity   Alcohol use: No   Drug use: Yes    Types: Marijuana    Comment: 3 days ago   Sexual activity: Never  Other Topics  Concern   Not on file  Social History Narrative   ** Merged History Encounter **       Lives at home with grandmother and step grandfather, aunt and two half brothers. 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      Lives with [name] and 2 half brothers.    He is going to work towards getting GED   He enjoys playing bass, (Psychiatric nurse - guitar)  drawing, and fantasy world building.       Getting his GED. 24-25 school year   Social Determinants of Corporate investment banker Strain: Not on file  Food Insecurity: Not on file  Transportation Needs: Not on file  Physical Activity: Not on file  Stress: Not on file  Social Connections: Not on file  Intimate Partner Violence: Not on file    Review of Systems Constitutional: Patient ***denies any unintentional weight loss or change in strength lntegumentary: Patient ***denies any rashes or pruritus Eyes: Patient denies ***dry eyes ENT: Patient ***denies  dry mouth Cardiovascular: Patient ***denies chest pain or syncope Respiratory: Patient ***denies shortness of breath Gastrointestinal: Patient ***denies nausea, vomiting, constipation, or diarrhea Musculoskeletal: Patient ***denies muscle cramps or weakness Neurologic: Patient ***denies convulsions or seizures Psychiatric: Patient ***denies memory problems Allergic/Immunologic: Patient ***denies recent allergic reaction(s) Hematologic/Lymphatic: Patient denies bleeding tendencies Endocrine: Patient ***denies heat/cold intolerance  GU: As per HPI.  OBJECTIVE There were no vitals filed for this visit. There is no height or weight on file to calculate BMI.  Physical Examination Constitutional: ***No obvious distress; patient is ***non-toxic appearing  Cardiovascular: ***No visible lower extremity edema.  Respiratory: The patient does ***not have audible wheezing/stridor; respirations do ***not appear labored   Gastrointestinal: Abdomen ***non-distended Musculoskeletal: ***Normal ROM of UEs  Skin: ***No obvious rashes/open sores  Neurologic: CN 2-12 grossly ***intact Psychiatric: Answered questions ***appropriately with ***normal affect  Hematologic/Lymphatic/Immunologic: ***No obvious bruises or sites of spontaneous bleeding  UA: ***negative / *** WBC/hpf, *** RBC/hpf, bacteria (***) PVR: *** ml  ASSESSMENT No diagnosis found. ***  Will plan for follow up in *** months / ***1 year or sooner if needed. Pt verbalized understanding and agreement. All questions were answered.  PLAN Advised the following: 1. *** 2. ***No follow-ups on file.  No orders of the defined types were placed in this encounter.   It has been explained that the patient is to follow regularly with their PCP in addition to all other providers involved in their care and to follow instructions provided by these respective offices. Patient advised to contact urology clinic if any urologic-pertaining questions, concerns, new symptoms or problems arise in the interim period.  There are no Patient Instructions on file for this visit.  Electronically signed by:  Donnita Falls, FNP   08/28/23    1:07 PM

## 2023-08-29 ENCOUNTER — Ambulatory Visit (INDEPENDENT_AMBULATORY_CARE_PROVIDER_SITE_OTHER): Payer: MEDICAID | Admitting: Urology

## 2023-08-29 ENCOUNTER — Encounter: Payer: Self-pay | Admitting: Urology

## 2023-08-29 VITALS — BP 112/74 | HR 121 | Temp 98.4°F

## 2023-08-29 DIAGNOSIS — R300 Dysuria: Secondary | ICD-10-CM | POA: Diagnosis not present

## 2023-08-29 DIAGNOSIS — M791 Myalgia, unspecified site: Secondary | ICD-10-CM

## 2023-08-29 DIAGNOSIS — R399 Unspecified symptoms and signs involving the genitourinary system: Secondary | ICD-10-CM

## 2023-08-29 DIAGNOSIS — M6289 Other specified disorders of muscle: Secondary | ICD-10-CM | POA: Insufficient documentation

## 2023-08-29 LAB — URINALYSIS, ROUTINE W REFLEX MICROSCOPIC
Bilirubin, UA: NEGATIVE
Glucose, UA: NEGATIVE
Ketones, UA: NEGATIVE
Leukocytes,UA: NEGATIVE
Nitrite, UA: NEGATIVE
Protein,UA: NEGATIVE
Specific Gravity, UA: 1.02 (ref 1.005–1.030)
Urobilinogen, Ur: 1 mg/dL (ref 0.2–1.0)
pH, UA: 7 (ref 5.0–7.5)

## 2023-08-29 LAB — MICROSCOPIC EXAMINATION: Bacteria, UA: NONE SEEN

## 2023-08-29 MED ORDER — BACLOFEN 10 MG PO TABS
ORAL_TABLET | ORAL | 5 refills | Status: DC
Start: 2023-08-29 — End: 2023-11-08

## 2023-08-29 NOTE — Progress Notes (Signed)
post void residual=0 ?

## 2023-08-29 NOTE — Progress Notes (Signed)
Mdx sent reference #[id number]

## 2023-08-31 ENCOUNTER — Ambulatory Visit (HOSPITAL_COMMUNITY)
Admission: RE | Admit: 2023-08-31 | Discharge: 2023-08-31 | Disposition: A | Discharge status: Home / Self Care | Payer: MEDICAID | Source: Ambulatory Visit | Attending: Urology | Admitting: Urology

## 2023-08-31 DIAGNOSIS — R300 Dysuria: Secondary | ICD-10-CM | POA: Diagnosis present

## 2023-08-31 DIAGNOSIS — R399 Unspecified symptoms and signs involving the genitourinary system: Secondary | ICD-10-CM | POA: Insufficient documentation

## 2023-09-04 ENCOUNTER — Other Ambulatory Visit: Payer: Self-pay | Admitting: Urology

## 2023-09-04 DIAGNOSIS — N3001 Acute cystitis with hematuria: Secondary | ICD-10-CM

## 2023-09-04 DIAGNOSIS — N1330 Unspecified hydronephrosis: Secondary | ICD-10-CM

## 2023-09-04 DIAGNOSIS — R3129 Other microscopic hematuria: Secondary | ICD-10-CM

## 2023-09-04 DIAGNOSIS — N390 Urinary tract infection, site not specified: Secondary | ICD-10-CM

## 2023-09-04 NOTE — Progress Notes (Signed)
CT ordered for further evaluation of severe left hydronephrosis seen on RUS in the setting of recurrent UTI, LUTS, and microscopic hematuria. Consult note sent to Dr. Retta Diones to determine if any additional workup is recommended at this time.

## 2023-09-05 ENCOUNTER — Telehealth: Payer: Self-pay

## 2023-09-05 NOTE — Telephone Encounter (Signed)
Patient is made aware and voiced understanding. Patient would like Sarah response via [employer].

## 2023-09-05 NOTE — Telephone Encounter (Signed)
-----   Message from Donnita Falls sent at 09/04/2023  9:26 AM EDT ----- Please let patient know that his renal US showed "Severe left hydronephrosis with left renal cortical thinning and atrophy" therefore I just ordered a CT abdomen/pelvis w/wo contrast for further evaluation. Also that I sent a consult note to Dr. Retta Diones and will notify patient if anything else is advised for further workup at this time. Thanks.

## 2023-09-06 ENCOUNTER — Encounter: Payer: Self-pay | Admitting: Family Medicine

## 2023-09-06 ENCOUNTER — Telehealth (INDEPENDENT_AMBULATORY_CARE_PROVIDER_SITE_OTHER): Payer: MEDICAID | Admitting: Family Medicine

## 2023-09-06 VITALS — Ht 65.0 in | Wt 153.0 lb

## 2023-09-06 DIAGNOSIS — E559 Vitamin D deficiency, unspecified: Secondary | ICD-10-CM | POA: Diagnosis not present

## 2023-09-06 DIAGNOSIS — R7301 Impaired fasting glucose: Secondary | ICD-10-CM | POA: Diagnosis not present

## 2023-09-06 DIAGNOSIS — E038 Other specified hypothyroidism: Secondary | ICD-10-CM | POA: Diagnosis not present

## 2023-09-06 DIAGNOSIS — R42 Dizziness and giddiness: Secondary | ICD-10-CM

## 2023-09-06 DIAGNOSIS — E7849 Other hyperlipidemia: Secondary | ICD-10-CM

## 2023-09-06 MED ORDER — MECLIZINE HCL 12.5 MG PO TABS
12.5000 mg | ORAL_TABLET | Freq: Three times a day (TID) | ORAL | 0 refills | Status: DC | PRN
Start: 2023-09-06 — End: 2023-11-08

## 2023-09-06 MED ORDER — VITAMIN D (ERGOCALCIFEROL) 1.25 MG (50000 UNIT) PO CAPS
50000.0000 [IU] | ORAL_CAPSULE | ORAL | 1 refills | Status: DC
Start: 2023-09-06 — End: 2024-01-17

## 2023-09-06 NOTE — Progress Notes (Signed)
Virtual Visit via Video Note  I connected with [name] on 09/06/23 at  9:00 AM EDT by a video enabled telemedicine application and verified that I am speaking with the correct person using two identifiers.  Patient Location: Home Provider Location: Office/Clinic  I discussed the limitations, risks, security, and privacy concerns of performing an evaluation and management service by video and the availability of in person appointments. I also discussed with the patient that there may be a patient responsible charge related to this service. The patient expressed understanding and agreed to proceed.  Subjective: PCP: Gilmore Laroche, FNP  Dizziness: The patient reports visiting urgent care on 09/05/2023 for similar complaints of visual disturbances and dizziness. He was informed that his vision was within desired limits and no infections or abnormalities were found in his ears. The patient reports adequate fluid intake but continues to experience dizziness, particularly with positional changes from sitting to standing, sometimes accompanied by a sensation of near-syncope. He also reports taking his daily iron supplement.  Recurrent UTI: The patient is currently under the care of urology and has concerns regarding his right kidney. He has a CT scan scheduled for early October and will be following up with urology for further evaluation.  Chief Complaint  Patient presents with   Insomnia    Ongoing since years.    Dizziness    Ongoing since the beginning of the year worsened 3 weeks ago   Urinary Tract Infection    Pt states recurrent uti's, states he has renal concerns does see urology, next appt is lat September or october   HPI   ROS: Per HPI  Current Outpatient Medications:    albuterol (PROAIR HFA) 108 (90 Base) MCG/ACT inhaler, Inhale 2 puffs into the lungs every 4 (four) hours as needed for wheezing or shortness of breath., Disp: 18 g, Rfl: 1   baclofen (LIORESAL) 10 MG  tablet, Take 1 tablet by mouth nightly at bedtime. May also take during the day every 8 hours as needed (maximum is 3 doses in 24 hours)., Disp: 90 tablet, Rfl: 5   ISOtretinoin (ACCUTANE) 40 MG capsule, Take 80 mg by mouth 2 (two) times daily., Disp: , Rfl:    meclizine (ANTIVERT) 12.5 MG tablet, Take 1 tablet (12.5 mg total) by mouth 3 (three) times daily as needed for dizziness., Disp: 30 tablet, Rfl: 0   methylphenidate (RITALIN) 10 MG tablet, Take 1 tablet (10 mg total) by mouth 2 (two) times daily with breakfast and lunch., Disp: 60 tablet, Rfl: 0   Spacer/Aero-Holding Chambers DEVI, 1 applicator by Does not apply route as needed., Disp: 1 application, Rfl: 0   testosterone cypionate (DEPOTESTOSTERONE CYPIONATE) 200 MG/ML injection, Inject 0.25 mLs (50 mg total) into the skin every 7 (seven) days., Disp: 4 mL, Rfl: 0   Vitamin D, Ergocalciferol, (DRISDOL) 1.25 MG (50000 UNIT) CAPS capsule, Take 1 capsule (50,000 Units total) by mouth every 7 (seven) days., Disp: 20 capsule, Rfl: 1  Observations/Objective: Today's Vitals   09/06/23 0857  Weight: 153 lb (69.4 kg)  Height: 5\' 5"  (1.651 m)  PainSc: 2   PainLoc: Generalized   Physical Exam Patient is well-developed, well-nourished in no acute distress.  Resting comfortably at home.  Head is normocephalic, atraumatic.  No labored breathing.  Speech is clear and coherent with logical content.  Patient is alert and oriented at baseline.   Assessment and Plan: Dizziness -     Meclizine HCl; Take 1 tablet (12.5 mg total) by  mouth 3 (three) times daily as needed for dizziness.  Dispense: 30 tablet; Refill: 0  Vitamin D deficiency -     Vitamin D (Ergocalciferol); Take 1 capsule (50,000 Units total) by mouth every 7 (seven) days.  Dispense: 20 capsule; Refill: 1 -     VITAMIN D 25 Hydroxy (Vit-D Deficiency, Fractures)  IFG (impaired fasting glucose) -     Hemoglobin A1c  Other specified hypothyroidism -     TSH + free T4  Other  hyperlipidemia -     Lipid panel -     CMP14+EGFR -     CBC with Differential/Platelet  Encouraged to increase his fluid intake at least 64 ounces daily Encouraged to change positions slowly from sitting to standing Recommended follow-up in the clinic in a week to assess orthostatic hypotension The patient reports that he is also concerned about having POTS Will assess during his next visit   Follow Up Instructions: Return in about 1 week (around 09/13/2023).   I discussed the assessment and treatment plan with the patient. The patient was provided an opportunity to ask questions, and all were answered. The patient agreed with the plan and demonstrated an understanding of the instructions.   The patient was advised to call back or seek an in-person evaluation if the symptoms worsen or if the condition fails to improve as anticipated.  The above assessment and management plan was discussed with the patient. The patient verbalized understanding of and has agreed to the management plan.   Gilmore Laroche, FNP

## 2023-09-07 ENCOUNTER — Encounter: Payer: Self-pay | Admitting: Family Medicine

## 2023-09-07 LAB — CBC WITH DIFFERENTIAL/PLATELET
Basophils Absolute: 0.1 10*3/uL (ref 0.0–0.2)
Basos: 1 %
EOS (ABSOLUTE): 0.1 10*3/uL (ref 0.0–0.4)
Eos: 1 %
Hematocrit: 47.5 % — ABNORMAL HIGH (ref 34.0–46.6)
Hemoglobin: 15.9 g/dL (ref 11.1–15.9)
Immature Grans (Abs): 0 10*3/uL (ref 0.0–0.1)
Immature Granulocytes: 0 %
Lymphocytes Absolute: 2.6 10*3/uL (ref 0.7–3.1)
Lymphs: 38 %
MCH: 30.3 pg (ref 26.6–33.0)
MCHC: 33.5 g/dL (ref 31.5–35.7)
MCV: 91 fL (ref 79–97)
Monocytes Absolute: 0.5 10*3/uL (ref 0.1–0.9)
Monocytes: 8 %
Neutrophils Absolute: 3.6 10*3/uL (ref 1.4–7.0)
Neutrophils: 52 %
Platelets: 283 10*3/uL (ref 150–450)
RBC: 5.25 x10E6/uL (ref 3.77–5.28)
RDW: 11.9 % (ref 11.7–15.4)
WBC: 6.9 10*3/uL (ref 3.4–10.8)

## 2023-09-07 LAB — CMP14+EGFR
ALT: 16 IU/L (ref 0–32)
AST: 15 IU/L (ref 0–40)
Albumin: 4.6 g/dL (ref 4.0–5.0)
Alkaline Phosphatase: 80 IU/L (ref 42–106)
BUN/Creatinine Ratio: 7 — ABNORMAL LOW (ref 9–23)
BUN: 6 mg/dL (ref 6–20)
Bilirubin Total: 0.6 mg/dL (ref 0.0–1.2)
CO2: 20 mmol/L (ref 20–29)
Calcium: 9.6 mg/dL (ref 8.7–10.2)
Chloride: 105 mmol/L (ref 96–106)
Creatinine, Ser: 0.82 mg/dL (ref 0.57–1.00)
Globulin, Total: 2.2 g/dL (ref 1.5–4.5)
Glucose: 89 mg/dL (ref 70–99)
Potassium: 4.2 mmol/L (ref 3.5–5.2)
Sodium: 141 mmol/L (ref 134–144)
Total Protein: 6.8 g/dL (ref 6.0–8.5)
eGFR: 106 mL/min/{1.73_m2} (ref >59)

## 2023-09-07 LAB — LIPID PANEL
Chol/HDL Ratio: 3.9 ratio (ref 0.0–4.4)
Cholesterol, Total: 114 mg/dL (ref 100–169)
HDL: 29 mg/dL — ABNORMAL LOW (ref >39)
LDL Chol Calc (NIH): 59 mg/dL (ref 0–109)
Triglycerides: 148 mg/dL — ABNORMAL HIGH (ref 0–89)
VLDL Cholesterol Cal: 26 mg/dL (ref 5–40)

## 2023-09-07 LAB — VITAMIN D 25 HYDROXY (VIT D DEFICIENCY, FRACTURES): Vit D, 25-Hydroxy: 19.5 ng/mL — ABNORMAL LOW (ref 30.0–100.0)

## 2023-09-07 LAB — TSH+FREE T4
Free T4: 1.11 ng/dL (ref 0.93–1.60)
TSH: 1.21 u[IU]/mL (ref 0.450–4.500)

## 2023-09-07 LAB — HEMOGLOBIN A1C
Est. average glucose Bld gHb Est-mCnc: 105 mg/dL
Hgb A1c MFr Bld: 5.3 % (ref 4.8–5.6)

## 2023-09-08 NOTE — Telephone Encounter (Signed)
Please see patient message below.  CT scheduled on 10/01

## 2023-09-11 ENCOUNTER — Other Ambulatory Visit: Payer: Self-pay | Admitting: Urology

## 2023-09-11 DIAGNOSIS — R238 Other skin changes: Secondary | ICD-10-CM

## 2023-09-11 MED ORDER — LIDOCAINE HCL 5 % EX GEL
1.0000 | CUTANEOUS | 0 refills | Status: DC | PRN
Start: 2023-09-11 — End: 2023-11-08

## 2023-09-13 ENCOUNTER — Ambulatory Visit: Payer: MEDICAID | Admitting: *Deleted

## 2023-09-13 ENCOUNTER — Telehealth: Payer: Self-pay

## 2023-09-13 DIAGNOSIS — Z3042 Encounter for surveillance of injectable contraceptive: Secondary | ICD-10-CM | POA: Diagnosis not present

## 2023-09-13 MED ORDER — MEDROXYPROGESTERONE ACETATE 150 MG/ML IM SUSY
150.0000 mg | PREFILLED_SYRINGE | Freq: Once | INTRAMUSCULAR | Status: AC
Start: 2023-09-13 — End: 2023-09-13
  Administered 2023-09-13: 150 mg via INTRAMUSCULAR

## 2023-09-13 MED ORDER — MEDROXYPROGESTERONE ACETATE 150 MG/ML IM SUSY
150.0000 mg | PREFILLED_SYRINGE | Freq: Once | INTRAMUSCULAR | Status: DC
Start: 2023-09-13 — End: 2023-09-13

## 2023-09-13 NOTE — Telephone Encounter (Signed)
Patient is made aware via voice mailed  "This pt has a Monday for follow-up of severe left-sided hydronephrosis.  A CT scan has been ordered. Can it be approved and scheduled before? Per. Dr. Retta Diones. The image is approved. It looks to be scheduled in October- if the MD is requesting sooner- the order needs to be changed to stat and centralized scheduling called to move CT up."

## 2023-09-13 NOTE — Progress Notes (Addendum)
   NURSE VISIT- INJECTION  SUBJECTIVE:  Colleen Lopez is a 17 y.o. No obstetric history on file. Female here for a Depo Provera for contraception/period management. He is a GYN patient.   OBJECTIVE:  There were no vitals taken for this visit.  Appears well, in no apparent distress  Injection administered in: Left upper quad. gluteus  Meds ordered this encounter  Medications   DISCONTD: medroxyPROGESTERone Acetate SUSY 150 mg   medroxyPROGESTERone Acetate SUSY 150 mg    ASSESSMENT: GYN patient Depo Provera for contraception/period management PLAN: Follow-up: in 11-13 weeks for next Depo  Pt needs new prescription sent in to pharmacy as it was discontinued by another office  Jobe Marker  09/13/2023 2:52 PM

## 2023-09-15 ENCOUNTER — Encounter: Payer: Self-pay | Admitting: Family Medicine

## 2023-09-15 ENCOUNTER — Encounter: Payer: Self-pay | Admitting: Urology

## 2023-09-15 ENCOUNTER — Ambulatory Visit (INDEPENDENT_AMBULATORY_CARE_PROVIDER_SITE_OTHER): Payer: MEDICAID | Admitting: Family Medicine

## 2023-09-15 VITALS — BP 108/72 | HR 77 | Ht 65.0 in | Wt 155.1 lb

## 2023-09-15 DIAGNOSIS — R42 Dizziness and giddiness: Secondary | ICD-10-CM | POA: Diagnosis not present

## 2023-09-15 NOTE — Assessment & Plan Note (Signed)
Encouraged to follow-up with cardiology on 10/03/2023 Encouraged to increase his fluid intake, salt intake, and change positions slowly Orthostatic in the clinic were within the desired limits with no significant change in blood pressure with positional changes

## 2023-09-15 NOTE — Patient Instructions (Addendum)
I appreciate the opportunity to provide care to you today!    Follow up:  4 months   Attached with your AVS, you will find valuable resources for self-education. I highly recommend dedicating some time to thoroughly examine them.   Please continue to a heart-healthy diet and increase your physical activities. Try to exercise for at least five days a week.    It was a pleasure to see you and I look forward to continuing to work together on your health and well-being. Please do not hesitate to call the office if you need care or have questions about your care.  In case of emergency, please visit the Emergency Department for urgent care, or contact our clinic at [phone] to schedule an appointment. We're here to help you!   Have a wonderful day and week. With Gratitude, Gilmore Laroche MSN, FNP-BC
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
## 2023-09-18 ENCOUNTER — Ambulatory Visit: Payer: MEDICAID | Admitting: Urology

## 2023-09-18 ENCOUNTER — Other Ambulatory Visit: Payer: Self-pay | Admitting: Obstetrics & Gynecology

## 2023-09-18 ENCOUNTER — Ambulatory Visit: Payer: MEDICAID

## 2023-09-18 DIAGNOSIS — N390 Urinary tract infection, site not specified: Secondary | ICD-10-CM

## 2023-09-18 DIAGNOSIS — Z3042 Encounter for surveillance of injectable contraceptive: Secondary | ICD-10-CM

## 2023-09-18 DIAGNOSIS — N1330 Unspecified hydronephrosis: Secondary | ICD-10-CM

## 2023-09-18 DIAGNOSIS — Z789 Other specified health status: Secondary | ICD-10-CM

## 2023-09-18 MED ORDER — MEDROXYPROGESTERONE ACETATE 150 MG/ML IM SUSP
150.0000 mg | INTRAMUSCULAR | 4 refills | Status: DC
Start: 2023-09-18 — End: 2024-01-17

## 2023-09-19 ENCOUNTER — Ambulatory Visit (HOSPITAL_COMMUNITY): Admission: RE | Admit: 2023-09-19 | Payer: MEDICAID | Source: Ambulatory Visit
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
## 2023-09-26 ENCOUNTER — Encounter (HOSPITAL_COMMUNITY): Payer: Self-pay | Admitting: Radiology

## 2023-09-26 ENCOUNTER — Ambulatory Visit (HOSPITAL_COMMUNITY)
Admission: RE | Admit: 2023-09-26 | Discharge: 2023-09-26 | Disposition: A | Discharge status: Home / Self Care | Payer: MEDICAID | Source: Ambulatory Visit | Attending: Urology | Admitting: Urology

## 2023-09-26 DIAGNOSIS — R3129 Other microscopic hematuria: Secondary | ICD-10-CM | POA: Diagnosis present

## 2023-09-26 DIAGNOSIS — N390 Urinary tract infection, site not specified: Secondary | ICD-10-CM

## 2023-09-26 DIAGNOSIS — N1330 Unspecified hydronephrosis: Secondary | ICD-10-CM | POA: Diagnosis present

## 2023-09-26 MED ORDER — IOHEXOL 300 MG/ML  SOLN
100.0000 mL | Freq: Once | INTRAMUSCULAR | Status: AC | PRN
  Administered 2023-09-26: 100 mL via INTRAVENOUS

## 2023-09-27 ENCOUNTER — Other Ambulatory Visit: Payer: Self-pay | Admitting: Urology

## 2023-09-27 ENCOUNTER — Telehealth: Payer: Self-pay

## 2023-09-27 DIAGNOSIS — N1330 Unspecified hydronephrosis: Secondary | ICD-10-CM

## 2023-09-27 NOTE — Telephone Encounter (Signed)
Tried calling patient with no answer, left vm for return call 

## 2023-09-27 NOTE — Progress Notes (Signed)
Consulted with Dr. 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 Normal renal function on 09/06/2023. Advised Lasix renogram; order placed. Left voice mail for patient with that recommendation and phone # for call back with any questions / concerns.   Colleen Georges, MSN, FNP-C, [facility] Urology Nurse Practitioner [facility] [place]

## 2023-09-27 NOTE — Telephone Encounter (Signed)
-----   Message from Donnita Falls sent at 09/27/2023 12:09 PM EDT ----- Lorain Childes in case patient calls back: Left voice mail for patient today with the info below and phone # for call back with any questions / concerns.   Consulted with Dr. 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 Advised Lasix renogram; order placed today. Advised to get that done soon to assess left kidney function in order to determine next steps for management.

## 2023-09-28 ENCOUNTER — Telehealth: Payer: Self-pay

## 2023-09-28 ENCOUNTER — Encounter (INDEPENDENT_AMBULATORY_CARE_PROVIDER_SITE_OTHER): Payer: Self-pay | Admitting: Pediatric Endocrinology

## 2023-09-28 NOTE — Telephone Encounter (Signed)
Patient return call. Patient was made aware and voiced understanding.

## 2023-09-28 NOTE — Telephone Encounter (Addendum)
Open in error

## 2023-10-02 ENCOUNTER — Other Ambulatory Visit: Payer: Self-pay | Admitting: Urology

## 2023-10-02 DIAGNOSIS — F840 Autistic disorder: Secondary | ICD-10-CM | POA: Insufficient documentation

## 2023-10-02 DIAGNOSIS — N1330 Unspecified hydronephrosis: Secondary | ICD-10-CM

## 2023-10-02 DIAGNOSIS — R109 Unspecified abdominal pain: Secondary | ICD-10-CM

## 2023-10-02 NOTE — Progress Notes (Signed)
Spoke with patient via phone today. He is currently symptomatic with intermittent severe left flank pain, lower midline abdominal cramping, sweating, dysuria, increased urinary frequency, and "feeling as if my kidneys aren't drained". He reports some nausea recently and was seen in ER yesterday for dizziness and pre-syncope, which is thought to be related to POTS (he has an upcoming appointment scheduled with cardiology for that workup).  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 A Lasix renogram was ordered on 09/27/2023; pending scheduling.   Consulted with Dr. Ronne Binning today, who advised left percutaneous nephrostomy placement as soon as possible. The rationale for that was explained to the patient and we discussed how that works. Pt verbalized understanding and agreement.  Order placed for IR left percutaneous nephrostomy placement. Spoke with Jerral Bonito, PA in IR. Will plan for procedure 1st available (tomorrow afternoon). Lynden Ang said she will contact patient today for scheduling details.   Will attempt to contact scheduling team for Lasix renogram to see if that can be done this week after left PCN placement to assess the functionality of patient's left kidney. Patient was informed today that left pyeloplasty or other surgical procedure may be recommended based on the results of the Lasix renogram. He was reassured that his overall renal function is normal at this time per labs performed yesterday during his ER visit (GFR >90; creatinine 1.01). Unfortunately no urinalysis was performed at that time.  Evette Georges, MSN, FNP-C, [facility] Urology Nurse Practitioner [facility] [place]

## 2023-10-02 NOTE — Telephone Encounter (Signed)
Please see patient concerns below.  Should we schedule and office visit?

## 2023-10-03 ENCOUNTER — Other Ambulatory Visit (HOSPITAL_COMMUNITY): Payer: MEDICAID

## 2023-10-04 ENCOUNTER — Other Ambulatory Visit (HOSPITAL_COMMUNITY): Payer: Self-pay

## 2023-10-04 ENCOUNTER — Other Ambulatory Visit: Payer: Self-pay | Admitting: Radiology

## 2023-10-04 DIAGNOSIS — N1330 Unspecified hydronephrosis: Secondary | ICD-10-CM

## 2023-10-04 NOTE — Consult Note (Incomplete)
Chief Complaint: Patient was seen in consultation today for left percutaneous nephrostomy  Referring Physician(s): Colleen Lopez  Supervising Physician: Colleen Lopez  Patient Status: Colleen Lopez  History of Present Illness: Colleen Lopez is a 17 y.o. 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 PMH also sig for ADHD, asthma, UTI's, POTS . Pt presents now with hx intermittent severe left flank pain, lower midline abdominal cramping, sweating, dysuria, increased urinary frequency, and "feeling as if my kidneys aren't drained". He reports some nausea recently and was seen in [facility] 10/13 for dizziness and pre-syncope, which was thought to be related to POTS (he has an upcoming appointment scheduled with cardiology for that workup) . CT A/P on 10/8 revealed:   1. Severe left hydronephrosis with scarring and cortical thinning in the left kidney especially centrally, with mildly delayed excretion from the left kidney. Appearance favors chronic UPJ stenosis. No stones observed. 2. Small left renal cysts are too small to characterize although statistically likely to be benign. No further imaging workup of these lesions is indicated  Pt has had left hydronephrosis dating back to 2014 by imaging; pt scheduled today for left percutaneous nephrostomy placement; most recent creat on 10/13 was normal   Past Medical History:  Diagnosis Date   Abdominal pain    ADHD (attention deficit hyperactivity disorder)    Asthma    Pervasive developmental disorder    PONV (postoperative nausea and vomiting)    Vision abnormalities    Vomiting     Past Surgical History:  Procedure Laterality Date   AREOLA/NIPPLE RECONSTRUCTION WITH GRAFT Bilateral 06/09/2023   Procedure: AREOLA/NIPPLE RECONSTRUCTION WITH GRAFT;  Surgeon: Glenna Fellows, [facility];  Location: [facility];  Service: Plastics;  Laterality: Bilateral;   septorhinoplasty  07/2020   SIMPLE MASTECTOMY WITH AXILLARY SENTINEL NODE BIOPSY Bilateral 06/09/2023   Procedure: SIMPLE MASTECTOMY;  Surgeon: Glenna Fellows, [facility];  Location: [facility];  Service: Plastics;  Laterality: Bilateral;    Allergies: Patient has no known allergies.  Medications: Prior to Admission medications   Medication Sig Start Date End Date Taking? Authorizing Provider  albuterol (PROAIR HFA) 108 (90 Base) MCG/ACT inhaler Inhale 2 puffs into the lungs every 4 (four) hours as needed for wheezing or shortness of breath. 08/03/23   Colleen Lopez  baclofen (LIORESAL) 10 MG tablet Take 1 tablet by mouth nightly at bedtime. May also take during the day every 8 hours as needed (maximum is 3 doses in 24 hours). 08/29/23   Colleen Lopez  ISOtretinoin (ACCUTANE) 40 MG capsule Take 80 mg by mouth 2 (two) times daily. 04/20/23   Colleen Lopez  Lidocaine HCl 5 % GEL Apply 1 Application topically as needed. 09/11/23   Colleen Lopez  meclizine (ANTIVERT) 12.5 MG tablet Take 1 tablet (12.5 mg total) by mouth 3 (three) times daily as needed for dizziness. 09/06/23   Colleen Lopez  medroxyPROGESTERone (DEPO-PROVERA) 150 MG/ML injection Inject 1 mL (150 mg total) into the muscle every 3 (three) months. 09/18/23 12/17/23  Colleen Lopez  methylphenidate (RITALIN) 10 MG tablet Take 1 tablet (10 mg total) by mouth 2 (two) times daily with breakfast and lunch. 05/25/23 05/24/24  Colleen Lopez  Spacer/Aero-Holding Deretha Emory DEVI 1 applicator by Does not apply route as needed. 09/10/21   Colleen Lopez  testosterone cypionate (DEPOTESTOSTERONE CYPIONATE)  200 MG/ML injection Inject 0.25 mLs (50 mg total) into the skin every 7 (seven) days. 08/15/23   Colleen Lopez  Vitamin D, Ergocalciferol, (DRISDOL) 1.25 MG (50000 UNIT) CAPS capsule Take 1 capsule (50,000 Units total) by mouth  every 7 (seven) days. 09/06/23   Colleen Lopez     Family History  Problem Relation Age of Onset   Obesity Maternal Grandmother    Diabetes type II Maternal Grandmother    Hypertension Maternal Grandmother    COPD Maternal Grandmother    Hyperlipidemia Maternal Grandmother    Heart disease Maternal Grandmother    Aneurysm Maternal Grandfather    Early death Maternal Grandfather    Bipolar disorder Mother    Drug abuse Mother    Drug abuse Father    Alcohol abuse Father    ADD / ADHD Brother    Bipolar disorder Paternal Uncle    Migraines Neg Hx     Social History   Socioeconomic History   Marital status: Significant Other    Spouse name: Not on file   Number of children: Not on file   Years of education: Not on file   Highest education level: Not on file  Occupational History   Not on file  Tobacco Use   Smoking status: Never    Passive exposure: Yes   Smokeless tobacco: Never  Vaping Use   Vaping status: Never Used  Substance and Sexual Activity   Alcohol use: No   Drug use: Yes    Types: Marijuana    Comment: 3 days ago   Sexual activity: Never  Other Topics Concern   Not on file  Social History Narrative   ** Merged History Encounter **       Lives at home with grandmother and step grandfather, aunt and two half brothers. 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      Lives with [name] and 2 half brothers.    He is going to work towards getting GED   He enjoys playing bass, (Psychiatric nurse - guitar)  drawing, and fantasy world building.       Getting his GED. 24-25 school year   Social Determinants of Corporate investment banker Strain: Not on file  Food Insecurity: Not on file  Transportation Needs: Not on file  Physical Activity: Not on file  Stress: Not on file  Social Connections: Not on file      Review of Systems: denies fever,HA, cough, N/V or bleeding; he does have occ  CP/dyspnea with his POTS, intermittent abd"pressure", L>R flank discomfort; he is anxious  Vital Signs: Vitals:   10/05/23 0805  BP: 136/84  Pulse: 94  Resp: 16  Temp: 98.6 F (37 C)  SpO2: 98%       Physical Exam; awake/alert; chest- CTA bilat; heart- RRR; abd-soft,+BS,NT; no sig LE edema  Imaging: CT ABDOMEN PELVIS W WO CONTRAST  Result Date: 09/27/2023 CLINICAL DATA:  Urinary tract infection. EXAM: CT ABDOMEN AND PELVIS WITHOUT AND WITH CONTRAST TECHNIQUE: Multidetector CT imaging of the abdomen and pelvis was performed following the standard protocol before and following the bolus administration of intravenous contrast. RADIATION DOSE REDUCTION: This exam was performed according to the departmental dose-optimization program which includes automated exposure control, adjustment of the mA and/or kV according to patient size and/or use of iterative reconstruction technique. CONTRAST:  OMNIPAQUE IOHEXOL 300 MG/ML  SOLN COMPARISON:  Renal  ultrasound 08/31/2023 showing left hydronephrosis FINDINGS: Lower chest: Unremarkable Hepatobiliary: Unremarkable Pancreas: Unremarkable Spleen: Unremarkable Adrenals/Urinary Tract: Both adrenal glands appear normal. Severe left hydronephrosis and substantial cortical thinning in the left kidney especially centrally, with mildly delayed excretion from the left kidney. No hydroureter. Appearance favors UPJ stenosis. No stones observed. Small left renal cysts are too small to characterize although statistically likely to be benign. No further imaging workup of these lesions is indicated. There is some excreted contrast in the capacious collecting system on the delayed images but no significant left ureteral contrast. Urinary bladder unremarkable. Stomach/Bowel: Unremarkable Vascular/Lymphatic: Unremarkable Reproductive: Unremarkable Other: No supplemental non-categorized findings. Musculoskeletal: Unremarkable IMPRESSION: 1. Severe left hydronephrosis with  scarring and cortical thinning in the left kidney especially centrally, with mildly delayed excretion from the left kidney. Appearance favors chronic UPJ stenosis. No stones observed. 2. Small left renal cysts are too small to characterize although statistically likely to be benign. No further imaging workup of these lesions is indicated. Electronically Signed   By: Gaylyn Rong M.D.   On: 09/27/2023 11:20    Labs:  CBC: Recent Labs    12/22/22 1142 04/04/23 1637 07/04/23 1600 09/06/23 1431  WBC 6.5 8.0 7.3 6.9  HGB 15.3 15.1 16.5* 15.9  HCT 45.4 43.9 47.7* 47.5*  PLT 356 309 320 283    COAGS: No results for input(s): "INR", "APTT" in the last 8760 hours.  BMP: Recent Labs    12/22/22 1142 07/04/23 1600 09/06/23 1431  NA 139 140 141  K 4.4 4.2 4.2  CL 103 106 105  CO2 21 21 20   GLUCOSE 90 83 89  BUN 9 8 6   CALCIUM 10.0 10.1 9.6  CREATININE 0.91 0.90 0.82    LIVER FUNCTION TESTS: Recent Labs    12/22/22 1142 04/26/23 0833 07/04/23 1600 09/06/23 1431  BILITOT 0.5 0.8 0.7 0.6  AST 18 15 13 15   ALT 19 10 12 16   ALKPHOS 102 99  --  80  PROT 7.5 7.6 7.7 6.8  ALBUMIN 4.9 4.7  --  4.6    TUMOR MARKERS: No results for input(s): "AFPTM", "CEA", "CA199", "CHROMGRNA" in the last 8760 hours.  Assessment and Plan: 17 y.o. 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 PMH also sig for ADHD, asthma, UTI's, POTS . Pt presents now with hx intermittent severe left flank pain, lower midline abdominal cramping, sweating, dysuria, increased urinary frequency, and "feeling as if my kidneys aren't drained". He reports some nausea recently and was seen in [facility] 10/13 for dizziness and pre-syncope, which was thought to be related to POTS (he has an upcoming appointment scheduled with cardiology for that workup) . CT A/P on 10/8 revealed:   1. Severe left  hydronephrosis with scarring and cortical thinning in the left kidney especially centrally, with mildly delayed excretion from the left kidney. Appearance favors chronic UPJ stenosis. No stones observed. 2. Small left renal cysts are too small to characterize although statistically likely to be benign. No further imaging workup of these lesions is indicated  Pt has had left hydronephrosis dating back to 2014 by imaging; pt scheduled today for left percutaneous nephrostomy placement; most recent creat on 10/13 was normal;Risks and benefits of left PCN placement was discussed with the patient /grandmother including, but not limited to, infection, bleeding, significant bleeding causing loss or decrease in renal function or damage to adjacent structures.  All of the patient's questions were answered, patient is agreeable to proceed.  Consent signed and in chart.      Thank you for this interesting consult.  I greatly enjoyed meeting Colleen Lopez and look forward to participating in their care.  A copy of this report was sent to the requesting provider on this date.  Electronically Signed: D. Jeananne Rama, PA-C 10/04/2023, 6:09 PM   I spent a total of  25 minutes   in face to face in clinical consultation, greater than 50% of which was counseling/coordinating care for left percutaneous nephrostomy

## 2023-10-05 ENCOUNTER — Ambulatory Visit (HOSPITAL_COMMUNITY)
Admission: RE | Admit: 2023-10-05 | Discharge: 2023-10-05 | Disposition: A | Discharge status: Home / Self Care | Payer: MEDICAID | Source: Ambulatory Visit | Attending: Family Medicine | Admitting: Family Medicine

## 2023-10-05 ENCOUNTER — Encounter (HOSPITAL_COMMUNITY): Payer: Self-pay

## 2023-10-05 ENCOUNTER — Ambulatory Visit (HOSPITAL_COMMUNITY)
Admission: RE | Admit: 2023-10-05 | Discharge: 2023-10-05 | Disposition: A | Discharge status: Home / Self Care | Payer: MEDICAID | Source: Ambulatory Visit | Attending: Interventional Radiology | Admitting: Interventional Radiology

## 2023-10-05 ENCOUNTER — Telehealth: Payer: Self-pay

## 2023-10-05 DIAGNOSIS — F649 Gender identity disorder, unspecified: Secondary | ICD-10-CM | POA: Diagnosis not present

## 2023-10-05 DIAGNOSIS — N1330 Unspecified hydronephrosis: Secondary | ICD-10-CM | POA: Insufficient documentation

## 2023-10-05 DIAGNOSIS — F32A Depression, unspecified: Secondary | ICD-10-CM | POA: Insufficient documentation

## 2023-10-05 DIAGNOSIS — R109 Unspecified abdominal pain: Secondary | ICD-10-CM | POA: Insufficient documentation

## 2023-10-05 DIAGNOSIS — J45909 Unspecified asthma, uncomplicated: Secondary | ICD-10-CM | POA: Insufficient documentation

## 2023-10-05 DIAGNOSIS — F419 Anxiety disorder, unspecified: Secondary | ICD-10-CM | POA: Insufficient documentation

## 2023-10-05 DIAGNOSIS — F909 Attention-deficit hyperactivity disorder, unspecified type: Secondary | ICD-10-CM | POA: Diagnosis not present

## 2023-10-05 DIAGNOSIS — G901 Familial dysautonomia [Riley-Day]: Secondary | ICD-10-CM | POA: Insufficient documentation

## 2023-10-05 HISTORY — DX: Familial dysautonomia (riley-day): G90.1

## 2023-10-05 HISTORY — PX: IR NEPHROSTOMY PLACEMENT LEFT: IMG6063

## 2023-10-05 HISTORY — DX: Postural orthostatic tachycardia syndrome (POTS): G90.A

## 2023-10-05 LAB — BASIC METABOLIC PANEL
Anion gap: 9 (ref 5–15)
BUN: 10 mg/dL (ref 6–20)
CO2: 23 mmol/L (ref 22–32)
Calcium: 9.2 mg/dL (ref 8.9–10.3)
Chloride: 104 mmol/L (ref 98–111)
Creatinine, Ser: 0.79 mg/dL (ref 0.44–1.00)
GFR, Estimated: >60 mL/min (ref >60)
Glucose, Bld: 104 mg/dL — ABNORMAL HIGH (ref 70–99)
Potassium: 3.9 mmol/L (ref 3.5–5.1)
Sodium: 136 mmol/L (ref 135–145)

## 2023-10-05 LAB — CBC
HCT: 48.6 % — ABNORMAL HIGH (ref 36.0–46.0)
Hemoglobin: 16.9 g/dL — ABNORMAL HIGH (ref 12.0–15.0)
MCH: 30.4 pg (ref 26.0–34.0)
MCHC: 34.8 g/dL (ref 30.0–36.0)
MCV: 87.4 fL (ref 80.0–100.0)
Platelets: 278 10*3/uL (ref 150–400)
RBC: 5.56 MIL/uL — ABNORMAL HIGH (ref 3.87–5.11)
RDW: 11.8 % (ref 11.5–15.5)
WBC: 7.4 10*3/uL (ref 4.0–10.5)
nRBC: 0 % (ref 0.0–0.2)

## 2023-10-05 LAB — PROTIME-INR
INR: 1.1 (ref 0.8–1.2)
Prothrombin Time: 14.5 s (ref 11.4–15.2)

## 2023-10-05 MED ORDER — LIDOCAINE-EPINEPHRINE 1 %-1:100000 IJ SOLN
20.0000 mL | Freq: Once | INTRAMUSCULAR | Status: AC
  Administered 2023-10-05: 10 mL via INTRADERMAL

## 2023-10-05 MED ORDER — METHYLPHENIDATE HCL 10 MG PO TABS: 10.0000 mg | ORAL_TABLET | Freq: Two times a day (BID) | ORAL | Status: DC

## 2023-10-05 MED ORDER — DIPHENHYDRAMINE HCL 50 MG/ML IJ SOLN
INTRAMUSCULAR | Status: AC | PRN
Start: 2023-10-05 — End: 2023-10-05
  Administered 2023-10-05: 25 mg via INTRAVENOUS

## 2023-10-05 MED ORDER — FENTANYL CITRATE (PF) 100 MCG/2ML IJ SOLN
INTRAMUSCULAR | Status: AC | PRN
Start: 2023-10-05 — End: 2023-10-05
  Administered 2023-10-05 (×4): 50 ug via INTRAVENOUS

## 2023-10-05 MED ORDER — SODIUM CHLORIDE 0.9 % IV SOLN
1.0000 g | Freq: Once | INTRAVENOUS | Status: AC
  Administered 2023-10-05: 1 g via INTRAVENOUS
  Filled 2023-10-05: qty 10

## 2023-10-05 MED ORDER — OXYCODONE HCL 5 MG PO TABS
10.0000 mg | ORAL_TABLET | ORAL | Status: DC | PRN
  Administered 2023-10-05: 10 mg via ORAL
  Filled 2023-10-05: qty 2

## 2023-10-05 MED ORDER — MIDAZOLAM HCL 2 MG/2ML IJ SOLN
INTRAMUSCULAR | Status: AC
  Filled 2023-10-05: qty 6

## 2023-10-05 MED ORDER — ONDANSETRON HCL 4 MG/2ML IJ SOLN: 4.0000 mg | Freq: Four times a day (QID) | INTRAMUSCULAR | Status: DC | PRN

## 2023-10-05 MED ORDER — ACETAMINOPHEN 500 MG PO TABS: 1000.0000 mg | ORAL_TABLET | Freq: Four times a day (QID) | ORAL | Status: DC | PRN

## 2023-10-05 MED ORDER — FENTANYL CITRATE (PF) 100 MCG/2ML IJ SOLN
INTRAMUSCULAR | Status: AC
  Filled 2023-10-05: qty 4

## 2023-10-05 MED ORDER — ISOTRETINOIN 40 MG PO CAPS: 80.0000 mg | ORAL_CAPSULE | Freq: Two times a day (BID) | ORAL | Status: DC

## 2023-10-05 MED ORDER — DIPHENHYDRAMINE HCL 50 MG/ML IJ SOLN
INTRAMUSCULAR | Status: AC
  Filled 2023-10-05: qty 1

## 2023-10-05 MED ORDER — ALBUTEROL SULFATE HFA 108 (90 BASE) MCG/ACT IN AERS: 2.0000 | INHALATION_SPRAY | RESPIRATORY_TRACT | Status: DC | PRN

## 2023-10-05 MED ORDER — MECLIZINE HCL 12.5 MG PO TABS: 12.5000 mg | ORAL_TABLET | Freq: Three times a day (TID) | ORAL | Status: DC | PRN

## 2023-10-05 MED ORDER — OXYCODONE HCL 5 MG PO TABS
ORAL_TABLET | ORAL | Status: AC
  Filled 2023-10-05: qty 2

## 2023-10-05 MED ORDER — SODIUM CHLORIDE 0.9% FLUSH: 10.0000 mL | Freq: Two times a day (BID) | INTRAVENOUS | Status: DC

## 2023-10-05 MED ORDER — IOHEXOL 300 MG/ML  SOLN
50.0000 mL | Freq: Once | INTRAMUSCULAR | Status: AC | PRN
  Administered 2023-10-05: 20 mL

## 2023-10-05 MED ORDER — SODIUM CHLORIDE 0.9% FLUSH: 5.0000 mL | Freq: Three times a day (TID) | INTRAVENOUS | Status: DC

## 2023-10-05 MED ORDER — SODIUM CHLORIDE 0.9 % IV SOLN: INTRAVENOUS | Status: DC

## 2023-10-05 MED ORDER — LIDOCAINE-EPINEPHRINE 1 %-1:100000 IJ SOLN
INTRAMUSCULAR | Status: AC
  Filled 2023-10-05: qty 1

## 2023-10-05 MED ORDER — OXYCODONE HCL 5 MG PO TABS: 5.0000 mg | ORAL_TABLET | ORAL | Status: DC | PRN

## 2023-10-05 MED ORDER — MIDAZOLAM HCL 2 MG/2ML IJ SOLN
INTRAMUSCULAR | Status: AC | PRN
Start: 2023-10-05 — End: 2023-10-05
  Administered 2023-10-05: 2 mg via INTRAVENOUS
  Administered 2023-10-05: 1 mg via INTRAVENOUS
  Administered 2023-10-05: 2 mg via INTRAVENOUS

## 2023-10-05 NOTE — Progress Notes (Signed)
Patient ID: Colleen Lopez, adult   DOB: 11/10/2006, 17 y.o.   MRN: [id number] Pt doing fairly well; VSS; has some soreness at left PCN site as expected;  draining blood-tinged urine in PCN; denies fever/chills/N/V; plan for dc home this afternoon; PCN care instructions reviewed by nurse; pt has NM renogram study scheduled at [facility] tomorrow at 10:30 am

## 2023-10-05 NOTE — Progress Notes (Signed)
Contacted Dr Elby Showers in regards to pt requesting to not take oral pain medication. No orders given for IV pain meds.

## 2023-10-05 NOTE — Procedures (Signed)
Interventional Radiology Procedure Note  Procedure: Image guided left percutaneous nephrostomy tube placement  Findings: Please refer to procedural dictation for full description. 10 Fr left lower pole nephrostomy.  Severe left hydronephrosis.  Urine sample sent for culture.  Complications: None immediate  Estimated Blood Loss: < 5 mL  Recommendations: Admit to observation.  If no signs of sepsis or hematuria the patient can likely be discharged home later today. Left nephrostomy to bag drainage.   Marliss Coots, MD

## 2023-10-05 NOTE — Telephone Encounter (Signed)
Patient had nephrostomy tube placed today. Patient stated IR directed patient to call today to be seen urgently.  Message sent to NP to advise.

## 2023-10-05 NOTE — Discharge Instructions (Addendum)
Flush nephrostomy tube using sterile/clean technique. Clamp nephrostomy tube and disconnect from drain bag; flush with sterile saline then reconnect. Perform every 8 hours. Common to have blood tinged drainage for the first 24 hours. Keep drainage bag below the level of the kidney. Call [phone] with any questions

## 2023-10-06 ENCOUNTER — Encounter (HOSPITAL_COMMUNITY): Payer: Self-pay

## 2023-10-06 ENCOUNTER — Encounter (HOSPITAL_COMMUNITY)
Admission: RE | Admit: 2023-10-06 | Discharge: 2023-10-06 | Disposition: A | Discharge status: Home / Self Care | Payer: MEDICAID | Source: Ambulatory Visit | Attending: Urology | Admitting: Urology

## 2023-10-06 DIAGNOSIS — N1330 Unspecified hydronephrosis: Secondary | ICD-10-CM | POA: Insufficient documentation

## 2023-10-06 LAB — URINE CULTURE: Culture: NO GROWTH

## 2023-10-06 MED ORDER — FUROSEMIDE 10 MG/ML IJ SOLN
35.0000 mg | Freq: Once | INTRAMUSCULAR | Status: AC
  Administered 2023-10-06: 35 mg via INTRAVENOUS

## 2023-10-06 MED ORDER — FUROSEMIDE 10 MG/ML IJ SOLN
INTRAMUSCULAR | Status: AC
  Filled 2023-10-06: qty 4

## 2023-10-06 MED ORDER — TECHNETIUM TC 99M MERTIATIDE
5.0000 | Freq: Once | INTRAVENOUS | Status: AC | PRN
  Administered 2023-10-06: 5 via INTRAVENOUS

## 2023-10-06 NOTE — Telephone Encounter (Signed)
Spoke wit patient yesterday. Patient was in no distress, just needed to schedule follow up with MD. Follow up appointment made.

## 2023-10-10 ENCOUNTER — Encounter: Payer: Self-pay | Admitting: Urology

## 2023-10-10 ENCOUNTER — Ambulatory Visit (INDEPENDENT_AMBULATORY_CARE_PROVIDER_SITE_OTHER): Payer: MEDICAID | Admitting: Urology

## 2023-10-10 VITALS — BP 135/79 | HR 89

## 2023-10-10 DIAGNOSIS — N1330 Unspecified hydronephrosis: Secondary | ICD-10-CM

## 2023-10-10 DIAGNOSIS — N130 Hydronephrosis with ureteropelvic junction obstruction: Secondary | ICD-10-CM

## 2023-10-10 NOTE — Progress Notes (Addendum)
History of Present Illness: "Colleen Lopez" comes in to discuss further management of left hydronephrosis.  Underwent urgent left percutaneous nephrostomy tube placement for hydronephrosis on 10/05/2023.  Also had a nuclear medicine renogram the day following with left-to-right splits being 26/74.  Here to discuss further management.  Does have multiple questions about the nephrostomy tube, the way the urine looks, management of the nephrostomy tube.  Does have mild dysuria.  Also has seen blood in the urine.  Past Medical History:  Diagnosis Date   Abdominal pain    ADHD (attention deficit hyperactivity disorder)    Asthma    Dysautonomia (HCC)    Pervasive developmental disorder    PONV (postoperative nausea and vomiting)    POTS (postural orthostatic tachycardia syndrome)    Vision abnormalities    Vomiting     Past Surgical History:  Procedure Laterality Date   AREOLA/NIPPLE RECONSTRUCTION WITH GRAFT Bilateral 06/09/2023   Procedure: AREOLA/NIPPLE RECONSTRUCTION WITH GRAFT;  Surgeon: Glenna Fellows, MD;  Location: [facility];  Service: Plastics;  Laterality: Bilateral;   IR NEPHROSTOMY PLACEMENT LEFT  10/05/2023   septorhinoplasty  07/2020   SIMPLE MASTECTOMY WITH AXILLARY SENTINEL NODE BIOPSY Bilateral 06/09/2023   Procedure: SIMPLE MASTECTOMY;  Surgeon: Glenna Fellows, MD;  Location: [facility];  Service: Plastics;  Laterality: Bilateral;    Home Medications:  Allergies as of 10/10/2023   No Known Allergies      Medication List        Accurate as of October 10, 2023  2:05 PM. If you have any questions, ask your nurse or doctor.          albuterol 108 (90 Base) MCG/ACT inhaler Commonly known as: ProAir HFA Inhale 2 puffs into the lungs every 4 (four) hours as needed for wheezing or shortness of breath.   baclofen 10 MG tablet Commonly known as: LIORESAL Take 1 tablet by mouth nightly at bedtime. May also take during the day every 8  hours as needed (maximum is 3 doses in 24 hours).   ISOtretinoin 40 MG capsule Commonly known as: ACCUTANE Take 80 mg by mouth 2 (two) times daily.   Lidocaine HCl 5 % Gel Apply 1 Application topically as needed.   meclizine 12.5 MG tablet Commonly known as: ANTIVERT Take 1 tablet (12.5 mg total) by mouth 3 (three) times daily as needed for dizziness.   medroxyPROGESTERone 150 MG/ML injection Commonly known as: DEPO-PROVERA Inject 1 mL (150 mg total) into the muscle every 3 (three) months.   methylphenidate 10 MG tablet Commonly known as: Ritalin Take 1 tablet (10 mg total) by mouth 2 (two) times daily with breakfast and lunch.   Spacer/Aero-Holding Rudean Curt 1 applicator by Does not apply route as needed.   testosterone cypionate 200 MG/ML injection Commonly known as: DEPOTESTOSTERONE CYPIONATE Inject 0.25 mLs (50 mg total) into the skin every 7 (seven) days.   Vitamin D (Ergocalciferol) 1.25 MG (50000 UNIT) Caps capsule Commonly known as: DRISDOL Take 1 capsule (50,000 Units total) by mouth every 7 (seven) days.        Allergies: No Known Allergies  Family History  Problem Relation Age of Onset   Obesity Maternal Grandmother    Diabetes type II Maternal Grandmother    Hypertension Maternal Grandmother    COPD Maternal Grandmother    Hyperlipidemia Maternal Grandmother    Heart disease Maternal Grandmother    Aneurysm Maternal Grandfather    Early death Maternal Grandfather    Bipolar disorder  Mother    Drug abuse Mother    Drug abuse Father    Alcohol abuse Father    ADD / ADHD Brother    Bipolar disorder Paternal Uncle    Migraines Neg Hx     Social History:  reports that he has never smoked. He has been exposed to tobacco smoke. He has never used smokeless tobacco. He reports current drug use. Drug: Marijuana. He reports that he does not drink alcohol.  ROS: A complete review of systems was performed.  All systems are negative except for pertinent  findings as noted.  Physical Exam:  Vital signs in last 24 hours: BP 135/79   Pulse 89  Constitutional:  Alert and oriented, No acute distress Cardiovascular: Regular rate  Respiratory: Normal respiratory effort GI: Nephrostomy tube site clean.  New dressing was placed following cleansing the nephrostomy junction with the skin. Lymphatic: No lymphadenopathy Neurologic: Grossly intact, no focal deficits Psychiatric: Normal mood and affect  I have reviewed prior pt notes  I have reviewed urinalysis results  I have independently reviewed prior imaging--nuclear medicine renogram, CT scan  I have reviewed prior urine culture--prior two cultures were negative   Impression/Assessment:  Left UPJ obstruction with significant hydro, now with appropriately placed percutaneous nephrostomy tube  Plan:  I discussed with [name] necessity to proceed with eventual pyeloplasty  I will work on getting this scheduled with Dr. Ronne Binning as well as a preoperative appointment  Percutaneous tube management discussed

## 2023-10-11 ENCOUNTER — Ambulatory Visit (INDEPENDENT_AMBULATORY_CARE_PROVIDER_SITE_OTHER): Payer: MEDICAID | Admitting: Urology

## 2023-10-11 VITALS — BP 130/74 | HR 96

## 2023-10-11 DIAGNOSIS — N1330 Unspecified hydronephrosis: Secondary | ICD-10-CM | POA: Diagnosis not present

## 2023-10-11 LAB — URINALYSIS, ROUTINE W REFLEX MICROSCOPIC
Bilirubin, UA: NEGATIVE
Glucose, UA: NEGATIVE
Ketones, UA: NEGATIVE
Leukocytes,UA: NEGATIVE
Nitrite, UA: NEGATIVE
RBC, UA: NEGATIVE
Specific Gravity, UA: 1.025 (ref 1.005–1.030)
Urobilinogen, Ur: 1 mg/dL (ref 0.2–1.0)
pH, UA: 7 (ref 5.0–7.5)

## 2023-10-15 ENCOUNTER — Encounter: Payer: Self-pay | Admitting: Urology

## 2023-10-15 NOTE — Progress Notes (Signed)
10/11/2023 7:24 PM   Colleen Lopez [birth year] [id number]  Referring provider: Gilmore Laroche, FNP [address] [place],  [place] [address]  Followup left hydronephrosis   HPI: Colleen Lopez is a 17yo here for evaluation of left hydronephrosis. 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 They then underwent nephrostomy tube placement on 10/05/2023. Renogram on 10/18 showed 26% left renal split function. The patient has been having intermittent left flank pain for 5 years. No history of recurrent UTI. NO history of nephrolithiasis. No history of gross hematuria   PMH: Past Medical History:  Diagnosis Date   Abdominal pain    ADHD (attention deficit hyperactivity disorder)    Asthma    Dysautonomia (HCC)    Pervasive developmental disorder    PONV (postoperative nausea and vomiting)    POTS (postural orthostatic tachycardia syndrome)    Vision abnormalities    Vomiting     Surgical History: Past Surgical History:  Procedure Laterality Date   AREOLA/NIPPLE RECONSTRUCTION WITH GRAFT Bilateral 06/09/2023   Procedure: AREOLA/NIPPLE RECONSTRUCTION WITH GRAFT;  Surgeon: Colleen Fellows, MD;  Location: [facility];  Service: Plastics;  Laterality: Bilateral;   IR NEPHROSTOMY PLACEMENT LEFT  10/05/2023   septorhinoplasty  07/2020   SIMPLE MASTECTOMY WITH AXILLARY SENTINEL NODE BIOPSY Bilateral 06/09/2023   Procedure: SIMPLE MASTECTOMY;  Surgeon: Colleen Fellows, MD;  Location: [facility];  Service: Plastics;  Laterality: Bilateral;    Home Medications:  Allergies as of 10/11/2023   No Known Allergies      Medication List        Accurate as of October 11, 2023 11:59 PM. If you have any questions, ask your nurse or doctor.          albuterol 108 (90 Base) MCG/ACT inhaler Commonly known as: ProAir HFA Inhale 2 puffs into the lungs every 4  (four) hours as needed for wheezing or shortness of breath.   baclofen 10 MG tablet Commonly known as: LIORESAL Take 1 tablet by mouth nightly at bedtime. May also take during the day every 8 hours as needed (maximum is 3 doses in 24 hours).   ISOtretinoin 40 MG capsule Commonly known as: ACCUTANE Take 80 mg by mouth 2 (two) times daily.   Lidocaine HCl 5 % Gel Apply 1 Application topically as needed.   meclizine 12.5 MG tablet Commonly known as: ANTIVERT Take 1 tablet (12.5 mg total) by mouth 3 (three) times daily as needed for dizziness.   medroxyPROGESTERone 150 MG/ML injection Commonly known as: DEPO-PROVERA Inject 1 mL (150 mg total) into the muscle every 3 (three) months.   methylphenidate 10 MG tablet Commonly known as: Ritalin Take 1 tablet (10 mg total) by mouth 2 (two) times daily with breakfast and lunch.   Spacer/Aero-Holding Rudean Curt 1 applicator by Does not apply route as needed.   testosterone cypionate 200 MG/ML injection Commonly known as: DEPOTESTOSTERONE CYPIONATE Inject 0.25 mLs (50 mg total) into the skin every 7 (seven) days.   Vitamin D (Ergocalciferol) 1.25 MG (50000 UNIT) Caps capsule Commonly known as: DRISDOL Take 1 capsule (50,000 Units total) by mouth every 7 (seven) days.        Allergies: No Known Allergies  Family History: Family History  Problem Relation Age of Onset   Obesity Maternal Grandmother    Diabetes type II Maternal Grandmother    Hypertension Maternal Grandmother    COPD Maternal Grandmother  Hyperlipidemia Maternal Grandmother    Heart disease Maternal Grandmother    Aneurysm Maternal Grandfather    Early death Maternal Grandfather    Bipolar disorder Mother    Drug abuse Mother    Drug abuse Father    Alcohol abuse Father    ADD / ADHD Brother    Bipolar disorder Paternal Uncle    Migraines Neg Hx     Social History:  reports that he has never smoked. He has been exposed to tobacco smoke. He has never  used smokeless tobacco. He reports current drug use. Drug: Marijuana. He reports that he does not drink alcohol.  ROS: All other review of systems were reviewed and are negative except what is noted above in HPI  Physical Exam: BP 130/74   Pulse 96   Constitutional:  Alert and oriented, No acute distress. HEENT: [place] AT, moist mucus membranes.  Trachea midline, no masses. Cardiovascular: No clubbing, cyanosis, or edema. Respiratory: Normal respiratory effort, no increased work of breathing. GI: Abdomen is soft, nontender, nondistended, no abdominal masses GU: No CVA tenderness.  Lymph: No cervical or inguinal lymphadenopathy. Skin: No rashes, bruises or suspicious lesions. Neurologic: Grossly intact, no focal deficits, moving all 4 extremities. Psychiatric: Normal mood and affect.  Laboratory Data: Lab Results  Component Value Date   WBC 7.4 10/05/2023   HGB 16.9 (H) 10/05/2023   HCT 48.6 (H) 10/05/2023   MCV 87.4 10/05/2023   PLT 278 10/05/2023    Lab Results  Component Value Date   CREATININE 0.79 10/05/2023    No results found for: "PSA"  Lab Results  Component Value Date   TESTOSTERONE 564 (H) 07/04/2023    Lab Results  Component Value Date   HGBA1C 5.3 09/06/2023    Urinalysis    Component Value Date/Time   COLORURINE YELLOW 02/27/2013 1318   APPEARANCEUR Clear 10/10/2023 1414   LABSPEC 1.020 02/27/2013 1318   PHURINE 8.5 (H) 02/27/2013 1318   GLUCOSEU Negative 10/10/2023 1414   HGBUR NEG 02/27/2013 1318   BILIRUBINUR Negative 10/10/2023 1414   KETONESUR negative 03/14/2023 1519   KETONESUR NEG 02/27/2013 1318   PROTEINUR Trace 10/10/2023 1414   PROTEINUR NEG 02/27/2013 1318   UROBILINOGEN 0.2 03/14/2023 1519   UROBILINOGEN 0.2 02/27/2013 1318   NITRITE Negative 10/10/2023 1414   NITRITE NEG 02/27/2013 1318   LEUKOCYTESUR Negative 10/10/2023 1414    Lab Results  Component Value Date   LABMICR Comment 10/10/2023   WBCUA 0-5 08/29/2023   LABEPIT  0-10 08/29/2023   BACTERIA None seen 08/29/2023    Pertinent Imaging: CT 09/26/2023: Images reviewed and discussed with the patient  No results found for this or any previous visit.  No results found for this or any previous visit.  No results found for this or any previous visit.  No results found for this or any previous visit.  Results for orders placed during the hospital encounter of 08/31/23  US RENAL  Narrative CLINICAL DATA:  Dysuria  EXAM: RENAL / URINARY TRACT ULTRASOUND COMPLETE  COMPARISON:  None Available.  FINDINGS: Right Kidney:  Renal measurements: 11.9 x 4.1 x 6.0 cm = volume: 153.4 mL. Echogenicity within normal limits. No mass or hydronephrosis visualized.  Left Kidney:  Renal measurements: 12.4 x 4.8 x 5.3 cm = volume: 164.0 mL. Marked left hydronephrosis with renal cortical thinning. No renal mass.  Bladder:  Appears normal for degree of bladder distention.  Other:  None.  IMPRESSION: Severe left hydronephrosis with left renal cortical  thinning and atrophy.   Electronically Signed By: Annia Belt M.D. On: 08/31/2023 10:56  No valid procedures specified. No results found for this or any previous visit.  No results found for this or any previous visit.   Assessment & Plan:    1. Hydronephrosis of left kidney The risks/benefits/alternatives to left robotic pyeloplasty was explained to the patient and he understands and wishes to proceed with surgery - Ambulatory Referral For Surgery Scheduling   No follow-ups on file.  Wilkie Aye, MD  [facility]

## 2023-10-15 NOTE — Patient Instructions (Signed)
Hydronephrosis  Hydronephrosis is the swelling of one or both kidneys due to a blockage that stops urine from flowing out of the body. Kidneys filter waste from the blood and produce urine. This condition can lead to kidney failure and may become life-threatening if not treated promptly. What are the causes? In infants and children, common causes include problems that occur when a baby is developing in the womb. These can include problems in the kidneys or in the tubes that drain urine into the bladder (ureters). In adults, common causes include: Kidney stones. Pregnancy. A tumor or cyst in the abdomen or pelvis. An enlarged prostate gland. Other causes include: Bladder infection. Scar tissue from a previous surgery or injury. A blood clot. Cancer of the prostate, bladder, uterus, ovary, or colon. What are the signs or symptoms? Symptoms of this condition include: Pain or discomfort in your side (flank) or abdomen. Swelling in your abdomen. Nausea and vomiting. Fever. Pain when passing urine. Feelings of urgency when you need to urinate. Urinating more often than normal. In some cases, you may not have any symptoms. How is this diagnosed? This condition may be diagnosed based on: Your symptoms and medical history. A physical exam. Blood and urine tests. Imaging tests, such as an ultrasound, CT scan, or MRI. A procedure to look at your urinary tract and bladder by inserting a scope into the urethra (cystoscopy). How is this treated? Treatment for this condition depends on where the blockage is, how long it has been there, and what caused it. The goal of treatment is to remove the blockage. Treatment may include: Antibiotic medicines to treat or prevent infection. A procedure to place a small, thin tube (stent) into a blocked ureter. The stent will keep the ureter open so that urine can drain through it. A nonsurgical procedure that crushes kidney stones with shock waves  (extracorporeal shock wave lithotripsy). If kidney failure occurs, treatment may include dialysis or a kidney transplant. Follow these instructions at home:  Take over-the-counter and prescription medicines only as told by your health care provider. If you were prescribed an antibiotic medicine, take it exactly as told by your health care provider. Do not stop taking the antibiotic even if you start to feel better. Rest and return to your normal activities as told by your health care provider. Ask your health care provider what activities are safe for you. Drink enough fluid to keep your urine pale yellow. Keep all follow-up visits. This is important. Contact a health care provider if: You continue to have symptoms after treatment. You develop new symptoms. Your urine becomes cloudy or bloody. You have a fever. Get help right away if: You have severe flank or abdominal pain. You cannot drink fluids without vomiting. Summary Hydronephrosis is the swelling of one or both kidneys due to a blockage that stops urine from flowing out of the body. Hydronephrosis can lead to kidney failure and may become life-threatening if not treated promptly. The goal of treatment is to remove the blockage. It may include a procedure to insert a stent into a blocked ureter, a procedure to break up kidney stones, or taking antibiotic medicines. Follow your health care provider's instructions for taking care of yourself at home, including instructions about drinking fluids, taking medicines, and limiting activities. This information is not intended to replace advice given to you by your health care provider. Make sure you discuss any questions you have with your health care provider. Document Revised: 03/24/2020 Document Reviewed: 03/24/2020 Elsevier   Patient Education  2024 [employer]  

## 2023-10-19 ENCOUNTER — Other Ambulatory Visit: Payer: Self-pay | Admitting: Urology

## 2023-10-19 DIAGNOSIS — Q6211 Congenital occlusion of ureteropelvic junction: Secondary | ICD-10-CM

## 2023-10-19 DIAGNOSIS — R109 Unspecified abdominal pain: Secondary | ICD-10-CM

## 2023-10-19 MED ORDER — OXYCODONE-ACETAMINOPHEN 5-325 MG PO TABS
1.0000 | ORAL_TABLET | ORAL | 0 refills | Status: AC | PRN
Start: 2023-10-19 — End: 2023-10-24

## 2023-10-19 NOTE — Telephone Encounter (Signed)
Will you  please consider sending in pain meds for patient, surgery is not until 12/05.  Please see patient message below.

## 2023-10-20 ENCOUNTER — Ambulatory Visit: Payer: MEDICAID

## 2023-10-20 DIAGNOSIS — N1330 Unspecified hydronephrosis: Secondary | ICD-10-CM

## 2023-10-20 NOTE — Progress Notes (Signed)
Patient arrived to clinic with complaints possible infection to nephrostomy tube site. Patient reports using neosporin to the area and after using noticed a white discharged.   Reviewed complaints with Dr. Ronne Binning.   Instructions received from Dr. Ronne Binning Flush Nephrostomy tube for urine to send off for urine culture . Review with patient to only use bacitracin to the site as neosporin will cause irrigation. Change dressing.  Nephrostomy tube flushed with ease and no discomfort to patient. Urine return noted  and placed in sterile specimen cup. Sent off for culture.  Reviewed with patient Dr. Ronne Binning recommendation for using Bacitracin only on site. Patient voiced understanding.  Dressing change completed. Old dressing scant amount of blood noted. Otherwise clean and dry. No redness noted at site.

## 2023-10-20 NOTE — Patient Instructions (Signed)
Dressing changed today in office.  Dr. Ronne Binning recommends stopping neosporin as this will cause irritation to the site and start using bacitracin. You may purchase that over the counter at [facility].   Urine specimen obtained from the nephrostomy tube and sent off for culture. The office will reach out to you next week with those results.

## 2023-10-20 NOTE — Telephone Encounter (Signed)
Spoke to patient via phone, per Dr. Ronne Binning, pt should come by for NV ua/uc.  Patient scheduled.

## 2023-10-22 ENCOUNTER — Encounter: Payer: Self-pay | Admitting: Urology

## 2023-10-24 LAB — URINE CULTURE

## 2023-10-25 ENCOUNTER — Other Ambulatory Visit (INDEPENDENT_AMBULATORY_CARE_PROVIDER_SITE_OTHER): Payer: Self-pay | Admitting: Pediatric Endocrinology

## 2023-10-25 ENCOUNTER — Other Ambulatory Visit: Payer: Self-pay

## 2023-10-25 MED ORDER — CIPROFLOXACIN HCL 500 MG PO TABS: 500.0000 mg | ORAL_TABLET | Freq: Two times a day (BID) | ORAL | 0 refills | Status: DC

## 2023-10-26 ENCOUNTER — Encounter (INDEPENDENT_AMBULATORY_CARE_PROVIDER_SITE_OTHER): Payer: Self-pay | Admitting: Pediatric Endocrinology

## 2023-10-27 ENCOUNTER — Encounter: Payer: Self-pay | Admitting: Family Medicine

## 2023-10-29 ENCOUNTER — Other Ambulatory Visit: Payer: Self-pay | Admitting: Family Medicine

## 2023-10-29 DIAGNOSIS — F640 Transsexualism: Secondary | ICD-10-CM

## 2023-10-30 ENCOUNTER — Telehealth: Payer: MEDICAID | Admitting: Family Medicine

## 2023-10-31 ENCOUNTER — Encounter (INDEPENDENT_AMBULATORY_CARE_PROVIDER_SITE_OTHER): Payer: Self-pay

## 2023-10-31 NOTE — Telephone Encounter (Signed)
Please see pt message below and refill if appropriate.

## 2023-11-03 ENCOUNTER — Other Ambulatory Visit: Payer: Self-pay | Admitting: Urology

## 2023-11-03 DIAGNOSIS — R109 Unspecified abdominal pain: Secondary | ICD-10-CM

## 2023-11-03 DIAGNOSIS — Q6211 Congenital occlusion of ureteropelvic junction: Secondary | ICD-10-CM

## 2023-11-03 DIAGNOSIS — N1330 Unspecified hydronephrosis: Secondary | ICD-10-CM

## 2023-11-03 MED ORDER — OXYCODONE-ACETAMINOPHEN 5-325 MG PO TABS: 1.0000 | ORAL_TABLET | ORAL | 0 refills | Status: DC | PRN

## 2023-11-03 NOTE — Telephone Encounter (Signed)
Can you please refill in Dr. Dimas Millin absence.

## 2023-11-07 ENCOUNTER — Other Ambulatory Visit: Payer: Self-pay | Admitting: Urology

## 2023-11-07 DIAGNOSIS — N1330 Unspecified hydronephrosis: Secondary | ICD-10-CM

## 2023-11-07 DIAGNOSIS — R109 Unspecified abdominal pain: Secondary | ICD-10-CM

## 2023-11-07 DIAGNOSIS — Q6211 Congenital occlusion of ureteropelvic junction: Secondary | ICD-10-CM

## 2023-11-07 MED ORDER — OXYCODONE-ACETAMINOPHEN 5-325 MG PO TABS: 1.0000 | ORAL_TABLET | ORAL | 0 refills | Status: AC | PRN

## 2023-11-08 ENCOUNTER — Ambulatory Visit (INDEPENDENT_AMBULATORY_CARE_PROVIDER_SITE_OTHER): Payer: MEDICAID | Admitting: Family Medicine

## 2023-11-08 ENCOUNTER — Encounter: Payer: Self-pay | Admitting: Family Medicine

## 2023-11-08 VITALS — BP 137/88 | HR 120 | Wt 157.8 lb

## 2023-11-08 DIAGNOSIS — Z789 Other specified health status: Secondary | ICD-10-CM | POA: Diagnosis not present

## 2023-11-08 DIAGNOSIS — G90A Postural orthostatic tachycardia syndrome (POTS): Secondary | ICD-10-CM | POA: Diagnosis not present

## 2023-11-08 MED ORDER — TESTOSTERONE CYPIONATE 200 MG/ML IM SOLN: 50.0000 mg | INTRAMUSCULAR | 3 refills | Status: DC

## 2023-11-08 MED ORDER — MINOXIDIL 2.5 MG PO TABS: ORAL_TABLET | ORAL | 1 refills | Status: DC

## 2023-11-08 NOTE — Patient Instructions (Signed)
Great to see you! Let me see you back in early January for follow up. Please send me a MyChart message if something comes up before then

## 2023-11-09 ENCOUNTER — Encounter: Payer: Self-pay | Admitting: Family Medicine

## 2023-11-09 NOTE — Progress Notes (Signed)
    CHIEF COMPLAINT / HPI: The patient to my practice for continuation of gender affirming hormone therapy.  Was seeing Dr. Vanessa  who has moved to [place].  Pretty happy with current level of transition.  Planning to move to a different state in the next few months.   PERTINENT  PMH / PSH: I have reviewed the patient's medications, allergies, past medical and surgical history, smoking status and updated in the EMR as appropriate.   OBJECTIVE:  BP 137/88   Pulse (!) 120   Wt 157 lb 12.8 oz (71.6 kg)   SpO2 98%   BMI 26.26 kg/m   GENERAL: Well-developed, no acute distress PSYCH: AxOx4. Good eye contact.. No psychomotor retardation or agitation. Appropriate speech fluency and content. Asks and answers questions appropriately. Mood is congruent.  ASSESSMENT / PLAN: 30 minutes spent in visit including chart review, face-to-face examination and discussion.  Female-to-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 Reviewed transition to date, medications, recent lab work.  Main issue is with some receding hairline.  We will try oral minoxidil as he has already tried topical.  Follow-up 1 month.  Did not get labs today for hormones etc. as lab was not available.  We will get that when I see him back.   Denny Levy MD

## 2023-11-09 NOTE — Assessment & Plan Note (Signed)
Reviewed transition to date, medications, recent lab work.  Main issue is with some receding hairline.  We will try oral minoxidil as he has already tried topical.  Follow-up 1 month.  Did not get labs today for hormones etc. as lab was not available.  We will get that when I see him back.

## 2023-11-13 ENCOUNTER — Encounter (HOSPITAL_COMMUNITY)
Admission: RE | Admit: 2023-11-13 | Discharge: 2023-11-13 | Disposition: A | Discharge status: Home / Self Care | Payer: MEDICAID | Source: Ambulatory Visit | Attending: Urology

## 2023-11-13 VITALS — BP 137/88 | HR 100 | Temp 97.9°F | Resp 16 | Ht 65.0 in | Wt 157.8 lb

## 2023-11-13 DIAGNOSIS — Z01812 Encounter for preprocedural laboratory examination: Secondary | ICD-10-CM | POA: Insufficient documentation

## 2023-11-13 DIAGNOSIS — Z01818 Encounter for other preprocedural examination: Secondary | ICD-10-CM

## 2023-11-13 DIAGNOSIS — N1330 Unspecified hydronephrosis: Secondary | ICD-10-CM | POA: Diagnosis not present

## 2023-11-13 LAB — TYPE AND SCREEN
ABO/RH(D): O POS
Antibody Screen: NEGATIVE

## 2023-11-13 LAB — POCT PREGNANCY, URINE: Preg Test, Ur: NEGATIVE

## 2023-11-13 NOTE — Telephone Encounter (Signed)
Please see pt concern below and advise

## 2023-11-13 NOTE — Patient Instructions (Signed)
Your procedure is scheduled on: 11/20/2023  Report to [facility] Main Entrance at  10:00   AM.  Call this number if you have problems the morning of surgery: [phone]   Remember:   Do not Eat or Drink after midnight         No Smoking the morning of surgery  :  Take these medicines the morning of surgery with A SIP OF WATER: Oxycodone if needed   Do not wear jewelry, make-up or nail polish.  Do not wear lotions, powders, or perfumes. You may wear deodorant.  Do not shave 48 hours prior to surgery. Men may shave face and neck.  Do not bring valuables to the hospital.  Contacts, dentures or bridgework may not be worn into surgery.  Leave suitcase in the car. After surgery it may be brought to your room.  For patients admitted to the hospital, checkout time is 11:00 AM the day of discharge.   Patients discharged the day of surgery will not be allowed to drive home.    Special Instructions: Shower using CHG night before surgery and shower the day of surgery use CHG.  Use special wash - you have one bottle of CHG for all showers.  You should use approximately 1/2 of the bottle for each shower. How to Use Chlorhexidine Before Surgery Chlorhexidine gluconate (CHG) is a germ-killing (antiseptic) solution that is used to clean the skin. It can get rid of the bacteria that normally live on the skin and can keep them away for about 24 hours. To clean your skin with CHG, you may be given: A CHG solution to use in the shower or as part of a sponge bath. A prepackaged cloth that contains CHG. Cleaning your skin with CHG may help lower the risk for infection: While you are staying in the intensive care unit of the hospital. If you have a vascular access, such as a central line, to provide short-term or long-term access to your veins. If you have a catheter to drain urine from your bladder. If you are on a ventilator. A ventilator is a machine that helps you breathe by moving air in and out of  your lungs. After surgery. What are the risks? Risks of using CHG include: A skin reaction. Hearing loss, if CHG gets in your ears and you have a perforated eardrum. Eye injury, if CHG gets in your eyes and is not rinsed out. The CHG product catching fire. Make sure that you avoid smoking and flames after applying CHG to your skin. Do not use CHG: If you have a chlorhexidine allergy or have previously reacted to chlorhexidine. On babies younger than 69 months of age. How to use CHG solution Use CHG only as told by your health care provider, and follow the instructions on the label. Use the full amount of CHG as directed. Usually, this is one bottle. During a shower Follow these steps when using CHG solution during a shower (unless your health care provider gives you different instructions): Start the shower. Use your normal soap and shampoo to wash your face and hair. Turn off the shower or move out of the shower stream. Pour the CHG onto a clean washcloth. Do not use any type of brush or rough-edged sponge. Starting at your neck, lather your body down to your toes. Make sure you follow these instructions: If you will be having surgery, pay special attention to the part of your body where you will be having surgery.  Scrub this area for at least 1 minute. Do not use CHG on your head or face. If the solution gets into your ears or eyes, rinse them well with water. Avoid your genital area. Avoid any areas of skin that have broken skin, cuts, or scrapes. Scrub your back and under your arms. Make sure to wash skin folds. Let the lather sit on your skin for 1-2 minutes or as long as told by your health care provider. Thoroughly rinse your entire body in the shower. Make sure that all body creases and crevices are rinsed well. Dry off with a clean towel. Do not put any substances on your body afterward--such as powder, lotion, or perfume--unless you are told to do so by your health care  provider. Only use lotions that are recommended by the manufacturer. Put on clean clothes or pajamas. If it is the night before your surgery, sleep in clean sheets.  During a sponge bath Follow these steps when using CHG solution during a sponge bath (unless your health care provider gives you different instructions): Use your normal soap and shampoo to wash your face and hair. Pour the CHG onto a clean washcloth. Starting at your neck, lather your body down to your toes. Make sure you follow these instructions: If you will be having surgery, pay special attention to the part of your body where you will be having surgery. Scrub this area for at least 1 minute. Do not use CHG on your head or face. If the solution gets into your ears or eyes, rinse them well with water. Avoid your genital area. Avoid any areas of skin that have broken skin, cuts, or scrapes. Scrub your back and under your arms. Make sure to wash skin folds. Let the lather sit on your skin for 1-2 minutes or as long as told by your health care provider. Using a different clean, wet washcloth, thoroughly rinse your entire body. Make sure that all body creases and crevices are rinsed well. Dry off with a clean towel. Do not put any substances on your body afterward--such as powder, lotion, or perfume--unless you are told to do so by your health care provider. Only use lotions that are recommended by the manufacturer. Put on clean clothes or pajamas. If it is the night before your surgery, sleep in clean sheets. How to use CHG prepackaged cloths Only use CHG cloths as told by your health care provider, and follow the instructions on the label. Use the CHG cloth on clean, dry skin. Do not use the CHG cloth on your head or face unless your health care provider tells you to. When washing with the CHG cloth: Avoid your genital area. Avoid any areas of skin that have broken skin, cuts, or scrapes. Before surgery Follow these steps  when using a CHG cloth to clean before surgery (unless your health care provider gives you different instructions): Using the CHG cloth, vigorously scrub the part of your body where you will be having surgery. Scrub using a back-and-forth motion for 3 minutes. The area on your body should be completely wet with CHG when you are done scrubbing. Do not rinse. Discard the cloth and let the area air-dry. Do not put any substances on the area afterward, such as powder, lotion, or perfume. Put on clean clothes or pajamas. If it is the night before your surgery, sleep in clean sheets.  For general bathing Follow these steps when using CHG cloths for general bathing (unless your health care  provider gives you different instructions). Use a separate CHG cloth for each area of your body. Make sure you wash between any folds of skin and between your fingers and toes. Wash your body in the following order, switching to a new cloth after each step: The front of your neck, shoulders, and chest. Both of your arms, under your arms, and your hands. Your stomach and groin area, avoiding the genitals. Your right leg and foot. Your left leg and foot. The back of your neck, your back, and your buttocks. Do not rinse. Discard the cloth and let the area air-dry. Do not put any substances on your body afterward--such as powder, lotion, or perfume--unless you are told to do so by your health care provider. Only use lotions that are recommended by the manufacturer. Put on clean clothes or pajamas. Contact a health care provider if: Your skin gets irritated after scrubbing. You have questions about using your solution or cloth. You swallow any chlorhexidine. Call your local poison control center ([phone] in the [place] Get help right away if: Your eyes itch badly, or they become very red or swollen. Your skin itches badly and is red or swollen. Your hearing changes. You have trouble seeing. You have swelling or  tingling in your mouth or throat. You have trouble breathing. These symptoms may represent a serious problem that is an emergency. Do not wait to see if the symptoms will go away. Get medical help right away. Call your local emergency services (911 in the [place] Do not drive yourself to the hospital. Summary Chlorhexidine gluconate (CHG) is a germ-killing (antiseptic) solution that is used to clean the skin. Cleaning your skin with CHG may help to lower your risk for infection. You may be given CHG to use for bathing. It may be in a bottle or in a prepackaged cloth to use on your skin. Carefully follow your health care provider's instructions and the instructions on the product label. Do not use CHG if you have a chlorhexidine allergy. Contact your health care provider if your skin gets irritated after scrubbing. This information is not intended to replace advice given to you by your health care provider. Make sure you discuss any questions you have with your health care provider. Document Revised: 04/04/2022 Document Reviewed: 02/15/2021 Elsevier Patient Education  2023 [employer]    Robot-Assisted Ureterolysis and Pyeloplasty, Care After After robot-assisted ureterolysis or pyeloplasty, it is common to have: Mild pain under your rib cage, in your back, or in your shoulder. Nausea. This may last for up to 2 days. Less of an appetite for up to a week. Soreness and mild pain in your abdomen. A small amount of blood or clear fluid coming from your incisions. Constipation and some discomfort caused by gas. This may last for a few days. A small amount of blood in your pee (urine). This may last for a few days. Mild soreness or discomfort when the soft tube (catheter) to drain your pee is removed. Follow these instructions at home: Medicines Take over-the-counter and prescription medicines only as told by your health care provider. If you were prescribed antibiotics, take them as told by  your provider. Do not stop using the antibiotic even if you start to feel better. Ask your provider if the medicine prescribed to you requires you to avoid driving or using machinery. Eating and drinking Avoid any foods or drinks that cause gas or discomfort. Drink enough fluid to keep your pee pale yellow. Do not  drink alcohol if your provider tells you not to drink. Managing constipation Your procedure may cause constipation. To prevent or treat constipation, you may need to: Take over-the-counter or prescription medicines. Eat foods that are high in fiber, such as beans, whole grains, and fresh fruits and vegetables. Limit foods that are high in fat and processed sugars, such as fried or sweet foods. Incision and drainage tube care  Follow instructions from your provider about how to take care of your incisions. Make sure you: Wash your hands with soap and water for at least 20 seconds before and after you change your bandages (dressings). If soap and water are not available, use hand sanitizer. Change your dressings as told by your provider. Leave stitches (sutures), skin glue, or tape strips in place. These skin closures may need to stay in place for 2 weeks or longer. If tape strip edges start to loosen and curl up, you may trim the loose edges. Do not remove tape strips completely unless your provider tells you to do that. Keep your incision areas clean and dry. Check your incision areas every day for signs of infection. Check for: Redness, swelling, or more pain. More fluid or blood. Warmth. Pus or a bad smell. If you have a drainage tube, follow instructions from your provider about how to care for it. Do not take the tube out yourself. Change the dressing around the tube as told by your provider. Write down how much fluid drains each day. Note any changes in how the fluid looks or smells. Activity Rest as told by your provider. Do not sit for a long time without moving. Get up  to take short walks every 1-2 hours. This will improve blood flow and breathing. Ask for help if you feel weak or unsteady. You may have to avoid lifting. Ask your provider how much you can safely lift. Return to your normal activities as told by your provider. Ask your provider what activities are safe for you. General instructions Do not use any products that contain nicotine or tobacco. These products include cigarettes, chewing tobacco, and vaping devices, such as e-cigarettes. These can delay healing after surgery. If you need help quitting, ask your provider. Do not take baths, swim, or use a hot tub until your provider approves. Ask your provider if you may take showers. You may only be allowed to take sponge baths. If you have a catheter to drain your pee, follow instructions from your provider about how to care for it and for your drainage bag. Wear compression stockings as told by your provider. These stockings help to prevent blood clots and reduce swelling in your legs. Keep all follow-up visits. Your provider will take out any drains or stents and check for problems. Your provider may give you more instructions. Make sure you know what you can and cannot do. Contact a health care provider if: You have nausea for more than 2 days after your procedure, or you vomit. You get a cough. You have pain that gets worse or does not get better with medicine. You have pain when you pee (urinate) or trouble peeing. You have more blood in your pee. You have not pooped in more than 2 days. You have any signs of infection at your incision areas. Get help right away if: You have chest pain. You have swelling, redness, or pain in your legs. You have severe pain. Your catheter has been removed, and you are not able to pee. You have a  catheter in place, and it is not draining your pee. These symptoms may be an emergency. Get help right away. Call 911. Do not wait to see if the symptoms will go  away. Do not drive yourself to the hospital. This information is not intended to replace advice given to you by your health care provider. Make sure you discuss any questions you have with your health care provider. Document Revised: 09/13/2022 Document Reviewed: 09/13/2022 Elsevier Patient Education  2024 [employer] Robot-Assisted Ureterolysis and Pyeloplasty  Robotic-assisted ureterolysis and pyeloplasty are procedures to relieve a blockage in the ureter or the ureteropelvic junction (UPJ). Ureters are the tubes that connect the kidneys to the bladder. The UPJ connects the ureter to part of the kidney. These procedures are done with a small camera and light (laparoscope) and tools that are attached to robotic arms. The surgeon controls the robotic arms to help do all or part of the surgery. Ureterolysis may be done when a ureter is blocked by scar tissue pushing against it. The scar tissue may be from a past surgery or infection. Pyeloplasty may be done when the ureter is blocked by something inside the UPJ. This may be caused by scar tissue, a fluid-filled sac (cyst), a growth, or a kidney stone. It may also be caused by a blood vessel crossing in front of the UPJ. Tell a health care provider about: Any allergies you have. All medicines you are taking, including vitamins, herbs, eye drops, creams, and over-the-counter medicines. Any problems you or family members have had with anesthesia. Any bleeding problems you have. Any surgeries you have had. Any medical conditions you have. Whether you are pregnant or may be pregnant. What are the risks? Your health care provider will talk with you about risks. These may include: Infection. Bleeding. Allergic reactions to medicines. Damage to other structures or organs. The blockage coming back. Pee (urine) leakage. Blood clots. The need to switch to an open surgery. An open surgery involves a bigger incision. It is not done with robotic  arms. What happens before the procedure? When to stop eating and drinking Follow instructions from your provider about what you may eat and drink. These may include: 8 hours before your procedure Stop eating most foods. Do not eat meat, fried foods, or fatty foods. Eat only light foods, such as toast or crackers. All liquids are okay except energy drinks and alcohol. 6 hours before your procedure Stop eating. Drink only clear liquids, such as water, clear fruit juice, black coffee, plain tea, and sports drinks. Do not drink energy drinks or alcohol. 2 hours before your procedure Stop drinking all liquids. You may be allowed to take medicines with small sips of water. If you do not follow your provider's instructions, your procedure may be delayed or canceled. Medicines Ask your provider about: Changing or stopping your regular medicines. These include any diabetes medicines or blood thinners you take. Taking medicines such as aspirin and ibuprofen. These medicines can thin your blood. Do not take them unless your provider tells you to. Taking over-the-counter medicines, vitamins, herbs, and supplements. Surgery safety Ask your provider: How your surgery site will be marked. What steps will be taken to help prevent infection. These steps may include: Removing hair at the surgery site. Washing skin with a soap that kills germs. Taking antibiotics. General instructions If you will be going home right after the procedure, plan to have a responsible adult: Take you home from the hospital. You will not be  allowed to drive. Care for you for the time you are told. What happens during the procedure? An IV will be inserted into one of your veins. You will be given: A sedative. This helps you relax. Anesthesia. This keeps you from feeling pain. It will make you fall asleep for surgery. A soft tube (catheter) will be put into your bladder to drain your pee. Some small incisions will be made  in your abdomen. A gas will be used to fill your abdomen. This will make it expand so the area can be seen more clearly. The laparoscope and other tools will be put through the incisions. For a pyeloplasty: An incision will be made in your ureter or UPJ. The blockage will be removed. If a blood vessel is causing the problem, it will be moved. In some cases, part of your ureter may be taken out. You may need to have a small mesh tube (stent) put in to stop pee from draining out of the incision. Incisions will be closed with stitches (sutures). For a ureterolysis, your ureter will be moved away from what is causing the blockage. You may not need a stent. If the blockage is caused by kidney stones, these will be removed. A drainage tube may be put in your abdomen. The incisions in your abdomen will be closed with sutures, skin glue, or tape strips. They may be covered with bandages (dressings). The procedure may vary among providers and hospitals. What happens after the procedure? Your blood pressure, heart rate, breathing rate, and blood oxygen level will be monitored until you leave the hospital. You may keep getting fluids and medicine through an IV. You should walk around as soon as you can. Wear compression stockings as told by your provider. These stockings help to prevent blood clots and reduce swelling in your legs. You may have a catheter to drain your pee. You may also have a tube to drain fluid from where you had surgery. This information is not intended to replace advice given to you by your health care provider. Make sure you discuss any questions you have with your health care provider. Document Revised: 09/13/2022 Document Reviewed: 09/13/2022 Elsevier Patient Education  2024 [employer] General Anesthesia, Adult, Care After The following information offers guidance on how to care for yourself after your procedure. Your health care provider may also give you more specific  instructions. If you have problems or questions, contact your health care provider. What can I expect after the procedure? After the procedure, it is common for people to: Have pain or discomfort at the IV site. Have nausea or vomiting. Have a sore throat or hoarseness. Have trouble concentrating. Feel cold or chills. Feel weak, sleepy, or tired (fatigue). Have soreness and body aches. These can affect parts of the body that were not involved in surgery. Follow these instructions at home: For the time period you were told by your health care provider:  Rest. Do not participate in activities where you could fall or become injured. Do not drive or use machinery. Do not drink alcohol. Do not take sleeping pills or medicines that cause drowsiness. Do not make important decisions or sign legal documents. Do not take care of children on your own. General instructions Drink enough fluid to keep your urine pale yellow. If you have sleep apnea, surgery and certain medicines can increase your risk for breathing problems. Follow instructions from your health care provider about wearing your sleep device: Anytime you are sleeping, including during  daytime naps. While taking prescription pain medicines, sleeping medicines, or medicines that make you drowsy. Return to your normal activities as told by your health care provider. Ask your health care provider what activities are safe for you. Take over-the-counter and prescription medicines only as told by your health care provider. Do not use any products that contain nicotine or tobacco. These products include cigarettes, chewing tobacco, and vaping devices, such as e-cigarettes. These can delay incision healing after surgery. If you need help quitting, ask your health care provider. Contact a health care provider if: You have nausea or vomiting that does not get better with medicine. You vomit every time you eat or drink. You have pain that does  not get better with medicine. You cannot urinate or have bloody urine. You develop a skin rash. You have a fever. Get help right away if: You have trouble breathing. You have chest pain. You vomit blood. These symptoms may be an emergency. Get help right away. Call 911. Do not wait to see if the symptoms will go away. Do not drive yourself to the hospital. Summary After the procedure, it is common to have a sore throat, hoarseness, nausea, vomiting, or to feel weak, sleepy, or fatigue. For the time period you were told by your health care provider, do not drive or use machinery. Get help right away if you have difficulty breathing, have chest pain, or vomit blood. These symptoms may be an emergency. This information is not intended to replace advice given to you by your health care provider. Make sure you discuss any questions you have with your health care provider. Document Revised: 03/04/2022 Document Reviewed: 03/04/2022 Elsevier Patient Education  2024 [employer].

## 2023-11-20 ENCOUNTER — Ambulatory Visit (HOSPITAL_COMMUNITY)
Admission: RE | Admit: 2023-11-20 | Discharge: 2023-11-21 | Disposition: A | Discharge status: Home / Self Care | Payer: MEDICAID | Attending: Urology | Admitting: Urology

## 2023-11-20 ENCOUNTER — Encounter (HOSPITAL_COMMUNITY): Payer: Self-pay | Admitting: Urology

## 2023-11-20 ENCOUNTER — Encounter (HOSPITAL_COMMUNITY)
Admission: RE | Disposition: A | Discharge status: Home / Self Care | Payer: Self-pay | Source: Home / Self Care | Attending: Urology

## 2023-11-20 ENCOUNTER — Ambulatory Visit (HOSPITAL_COMMUNITY): Payer: MEDICAID | Admitting: Anesthesiology

## 2023-11-20 ENCOUNTER — Other Ambulatory Visit: Payer: Self-pay

## 2023-11-20 ENCOUNTER — Ambulatory Visit (HOSPITAL_BASED_OUTPATIENT_CLINIC_OR_DEPARTMENT_OTHER): Payer: MEDICAID | Admitting: Anesthesiology

## 2023-11-20 DIAGNOSIS — G901 Familial dysautonomia [Riley-Day]: Secondary | ICD-10-CM | POA: Diagnosis not present

## 2023-11-20 DIAGNOSIS — N135 Crossing vessel and stricture of ureter without hydronephrosis: Secondary | ICD-10-CM | POA: Diagnosis not present

## 2023-11-20 DIAGNOSIS — J45909 Unspecified asthma, uncomplicated: Secondary | ICD-10-CM | POA: Insufficient documentation

## 2023-11-20 DIAGNOSIS — N736 Female pelvic peritoneal adhesions (postinfective): Secondary | ICD-10-CM | POA: Insufficient documentation

## 2023-11-20 DIAGNOSIS — N130 Hydronephrosis with ureteropelvic junction obstruction: Secondary | ICD-10-CM

## 2023-11-20 DIAGNOSIS — N131 Hydronephrosis with ureteral stricture, not elsewhere classified: Secondary | ICD-10-CM | POA: Insufficient documentation

## 2023-11-20 DIAGNOSIS — N1330 Unspecified hydronephrosis: Secondary | ICD-10-CM

## 2023-11-20 DIAGNOSIS — F909 Attention-deficit hyperactivity disorder, unspecified type: Secondary | ICD-10-CM | POA: Insufficient documentation

## 2023-11-20 DIAGNOSIS — Z833 Family history of diabetes mellitus: Secondary | ICD-10-CM | POA: Diagnosis not present

## 2023-11-20 DIAGNOSIS — Z793 Long term (current) use of hormonal contraceptives: Secondary | ICD-10-CM | POA: Insufficient documentation

## 2023-11-20 DIAGNOSIS — I951 Orthostatic hypotension: Secondary | ICD-10-CM | POA: Diagnosis not present

## 2023-11-20 DIAGNOSIS — Q6239 Other obstructive defects of renal pelvis and ureter: Secondary | ICD-10-CM

## 2023-11-20 HISTORY — PX: ROBOT ASSISTED PYELOPLASTY: SHX5143

## 2023-11-20 LAB — CBC
HCT: 40.8 % (ref 36.0–46.0)
Hemoglobin: 14.3 g/dL (ref 12.0–15.0)
MCH: 30.8 pg (ref 26.0–34.0)
MCHC: 35 g/dL (ref 30.0–36.0)
MCV: 87.9 fL (ref 80.0–100.0)
Platelets: 241 10*3/uL (ref 150–400)
RBC: 4.64 MIL/uL (ref 3.87–5.11)
RDW: 11.9 % (ref 11.5–15.5)
WBC: 15.4 10*3/uL — ABNORMAL HIGH (ref 4.0–10.5)
nRBC: 0 % (ref 0.0–0.2)

## 2023-11-20 LAB — BASIC METABOLIC PANEL
Anion gap: 10 (ref 5–15)
BUN: 10 mg/dL (ref 6–20)
CO2: 21 mmol/L — ABNORMAL LOW (ref 22–32)
Calcium: 8.9 mg/dL (ref 8.9–10.3)
Chloride: 106 mmol/L (ref 98–111)
Creatinine, Ser: 0.88 mg/dL (ref 0.44–1.00)
GFR, Estimated: >60 mL/min (ref >60)
Glucose, Bld: 131 mg/dL — ABNORMAL HIGH (ref 70–99)
Potassium: 3.8 mmol/L (ref 3.5–5.1)
Sodium: 137 mmol/L (ref 135–145)

## 2023-11-20 LAB — ABO/RH: ABO/RH(D): O POS

## 2023-11-20 SURGERY — PYELOPLASTY, ROBOT-ASSISTED
Anesthesia: General | Site: Ureter | Laterality: Left

## 2023-11-20 MED ORDER — MIDAZOLAM HCL 2 MG/2ML IJ SOLN
INTRAMUSCULAR | Status: AC
  Filled 2023-11-20: qty 2

## 2023-11-20 MED ORDER — WATER FOR IRRIGATION, STERILE IR SOLN
Status: DC | PRN
  Administered 2023-11-20: 1000 mL

## 2023-11-20 MED ORDER — OXYBUTYNIN CHLORIDE 5 MG PO TABS
5.0000 mg | ORAL_TABLET | Freq: Three times a day (TID) | ORAL | Status: DC | PRN
  Administered 2023-11-20 – 2023-11-21 (×2): 5 mg via ORAL
  Filled 2023-11-20 (×2): qty 1

## 2023-11-20 MED ORDER — BUPIVACAINE HCL (PF) 0.5 % IJ SOLN
INTRAMUSCULAR | Status: AC
  Filled 2023-11-20: qty 30

## 2023-11-20 MED ORDER — SEVOFLURANE IN SOLN
RESPIRATORY_TRACT | Status: AC
  Filled 2023-11-20: qty 500

## 2023-11-20 MED ORDER — HYDROMORPHONE HCL 1 MG/ML IJ SOLN
0.5000 mg | INTRAMUSCULAR | Status: DC | PRN
  Administered 2023-11-20 – 2023-11-21 (×4): 1 mg via INTRAVENOUS
  Administered 2023-11-21: 0.5 mg via INTRAVENOUS
  Filled 2023-11-20 (×5): qty 1

## 2023-11-20 MED ORDER — OXYCODONE HCL 5 MG PO TABS: 5.0000 mg | ORAL_TABLET | Freq: Once | ORAL | Status: DC | PRN

## 2023-11-20 MED ORDER — PROPOFOL 10 MG/ML IV BOLUS
INTRAVENOUS | Status: DC | PRN
  Administered 2023-11-20: 180 mg via INTRAVENOUS
  Administered 2023-11-20: 20 mg via INTRAVENOUS

## 2023-11-20 MED ORDER — CEFAZOLIN SODIUM-DEXTROSE 2-4 GM/100ML-% IV SOLN
2.0000 g | Freq: Three times a day (TID) | INTRAVENOUS | Status: AC
  Administered 2023-11-20 – 2023-11-21 (×2): 2 g via INTRAVENOUS
  Filled 2023-11-20 (×2): qty 100

## 2023-11-20 MED ORDER — ACETAMINOPHEN 325 MG PO TABS
650.0000 mg | ORAL_TABLET | ORAL | Status: DC | PRN
Start: 2023-11-20 — End: 2023-11-21

## 2023-11-20 MED ORDER — DEXAMETHASONE SODIUM PHOSPHATE 10 MG/ML IJ SOLN
INTRAMUSCULAR | Status: DC | PRN
  Administered 2023-11-20: 10 mg via INTRAVENOUS

## 2023-11-20 MED ORDER — ZOLPIDEM TARTRATE 5 MG PO TABS: 5.0000 mg | ORAL_TABLET | Freq: Every evening | ORAL | Status: DC | PRN

## 2023-11-20 MED ORDER — ONDANSETRON HCL 4 MG/2ML IJ SOLN: 4.0000 mg | Freq: Once | INTRAMUSCULAR | Status: DC | PRN

## 2023-11-20 MED ORDER — BUPIVACAINE HCL (PF) 0.5 % IJ SOLN
INTRAMUSCULAR | Status: DC | PRN
  Administered 2023-11-20: 20 mL

## 2023-11-20 MED ORDER — MINOXIDIL 2.5 MG PO TABS
2.5000 mg | ORAL_TABLET | Freq: Every day | ORAL | Status: DC
  Administered 2023-11-20 – 2023-11-21 (×2): 2.5 mg via ORAL
  Filled 2023-11-20 (×3): qty 1

## 2023-11-20 MED ORDER — ROCURONIUM BROMIDE 100 MG/10ML IV SOLN
INTRAVENOUS | Status: DC | PRN
  Administered 2023-11-20: 20 mg via INTRAVENOUS
  Administered 2023-11-20: 60 mg via INTRAVENOUS
  Administered 2023-11-20: 40 mg via INTRAVENOUS

## 2023-11-20 MED ORDER — DIPHENHYDRAMINE HCL 12.5 MG/5ML PO ELIX: 12.5000 mg | ORAL_SOLUTION | Freq: Four times a day (QID) | ORAL | Status: DC | PRN

## 2023-11-20 MED ORDER — HYDROMORPHONE HCL 1 MG/ML IJ SOLN
INTRAMUSCULAR | Status: DC | PRN
  Administered 2023-11-20: .5 mg via INTRAVENOUS

## 2023-11-20 MED ORDER — ORAL CARE MOUTH RINSE: 15.0000 mL | Freq: Once | OROMUCOSAL | Status: AC

## 2023-11-20 MED ORDER — DEXTROSE-SODIUM CHLORIDE 5-0.45 % IV SOLN: INTRAVENOUS | Status: DC

## 2023-11-20 MED ORDER — SENNOSIDES-DOCUSATE SODIUM 8.6-50 MG PO TABS
2.0000 | ORAL_TABLET | Freq: Every day | ORAL | Status: DC
  Administered 2023-11-20: 2 via ORAL
  Filled 2023-11-20: qty 2

## 2023-11-20 MED ORDER — PROPOFOL 500 MG/50ML IV EMUL
INTRAVENOUS | Status: AC
  Filled 2023-11-20: qty 50

## 2023-11-20 MED ORDER — DEXAMETHASONE SODIUM PHOSPHATE 10 MG/ML IJ SOLN
INTRAMUSCULAR | Status: AC
  Filled 2023-11-20: qty 1

## 2023-11-20 MED ORDER — ROCURONIUM BROMIDE 10 MG/ML (PF) SYRINGE
PREFILLED_SYRINGE | INTRAVENOUS | Status: AC
  Filled 2023-11-20: qty 10

## 2023-11-20 MED ORDER — HYDROMORPHONE HCL 1 MG/ML IJ SOLN
INTRAMUSCULAR | Status: AC
  Filled 2023-11-20: qty 0.5

## 2023-11-20 MED ORDER — CHLORHEXIDINE GLUCONATE 0.12 % MT SOLN
15.0000 mL | Freq: Once | OROMUCOSAL | Status: AC
  Administered 2023-11-20: 15 mL via OROMUCOSAL
  Filled 2023-11-20: qty 15

## 2023-11-20 MED ORDER — HYDROMORPHONE HCL 1 MG/ML IJ SOLN
0.2500 mg | INTRAMUSCULAR | Status: DC | PRN
Start: 2023-11-20 — End: 2023-11-20
  Administered 2023-11-20 (×2): 0.5 mg via INTRAVENOUS
  Filled 2023-11-20 (×2): qty 0.5

## 2023-11-20 MED ORDER — ONDANSETRON HCL 4 MG/2ML IJ SOLN: 4.0000 mg | INTRAMUSCULAR | Status: DC | PRN

## 2023-11-20 MED ORDER — CEFAZOLIN SODIUM-DEXTROSE 2-4 GM/100ML-% IV SOLN
2.0000 g | INTRAVENOUS | Status: AC
  Administered 2023-11-20: 2 g via INTRAVENOUS
  Filled 2023-11-20: qty 100

## 2023-11-20 MED ORDER — LIDOCAINE HCL (PF) 2 % IJ SOLN
INTRAMUSCULAR | Status: AC
  Filled 2023-11-20: qty 5

## 2023-11-20 MED ORDER — LIDOCAINE HCL (CARDIAC) PF 100 MG/5ML IV SOSY
PREFILLED_SYRINGE | INTRAVENOUS | Status: DC | PRN
  Administered 2023-11-20: 80 mg via INTRAVENOUS

## 2023-11-20 MED ORDER — DIPHENHYDRAMINE HCL 50 MG/ML IJ SOLN
12.5000 mg | Freq: Four times a day (QID) | INTRAMUSCULAR | Status: DC | PRN
  Administered 2023-11-21: 25 mg via INTRAVENOUS
  Filled 2023-11-20: qty 1

## 2023-11-20 MED ORDER — LACTATED RINGERS IV SOLN: INTRAVENOUS | Status: DC

## 2023-11-20 MED ORDER — ONDANSETRON HCL 4 MG/2ML IJ SOLN
INTRAMUSCULAR | Status: DC | PRN
  Administered 2023-11-20: 4 mg via INTRAVENOUS

## 2023-11-20 MED ORDER — OXYCODONE HCL 5 MG/5ML PO SOLN: 5.0000 mg | Freq: Once | ORAL | Status: DC | PRN

## 2023-11-20 MED ORDER — PROPOFOL 10 MG/ML IV BOLUS
INTRAVENOUS | Status: AC
  Filled 2023-11-20: qty 20

## 2023-11-20 MED ORDER — ONDANSETRON HCL 4 MG/2ML IJ SOLN
INTRAMUSCULAR | Status: AC
  Filled 2023-11-20: qty 2

## 2023-11-20 MED ORDER — SUGAMMADEX SODIUM 200 MG/2ML IV SOLN
INTRAVENOUS | Status: DC | PRN
  Administered 2023-11-20: 200 mg via INTRAVENOUS

## 2023-11-20 MED ORDER — PHENYLEPHRINE 80 MCG/ML (10ML) SYRINGE FOR IV PUSH (FOR BLOOD PRESSURE SUPPORT)
PREFILLED_SYRINGE | INTRAVENOUS | Status: AC
  Filled 2023-11-20: qty 10

## 2023-11-20 MED ORDER — FENTANYL CITRATE (PF) 100 MCG/2ML IJ SOLN
INTRAMUSCULAR | Status: DC | PRN
  Administered 2023-11-20 (×5): 50 ug via INTRAVENOUS

## 2023-11-20 MED ORDER — FENTANYL CITRATE (PF) 250 MCG/5ML IJ SOLN
INTRAMUSCULAR | Status: AC
  Filled 2023-11-20: qty 5

## 2023-11-20 MED ORDER — OXYCODONE HCL 5 MG PO TABS
5.0000 mg | ORAL_TABLET | ORAL | Status: DC | PRN
  Administered 2023-11-20 – 2023-11-21 (×3): 5 mg via ORAL
  Filled 2023-11-20 (×3): qty 1

## 2023-11-20 MED ORDER — DEXMEDETOMIDINE HCL IN NACL 80 MCG/20ML IV SOLN
INTRAVENOUS | Status: AC
Start: 2023-11-20 — End: ?
  Filled 2023-11-20: qty 40

## 2023-11-20 MED ORDER — DEXMEDETOMIDINE HCL IN NACL 80 MCG/20ML IV SOLN
INTRAVENOUS | Status: DC | PRN
  Administered 2023-11-20: 4 ug via INTRAVENOUS
  Administered 2023-11-20: 8 ug via INTRAVENOUS

## 2023-11-20 MED ORDER — MIDAZOLAM HCL 2 MG/2ML IJ SOLN
INTRAMUSCULAR | Status: DC | PRN
  Administered 2023-11-20: 2 mg via INTRAVENOUS

## 2023-11-20 SURGICAL SUPPLY — 50 items
BLADE SURG 15 STRL LF DISP TIS (BLADE) ×1 IMPLANT
CHLORAPREP W/TINT 26 (MISCELLANEOUS) ×1 IMPLANT
COVER LIGHT HANDLE STERIS (MISCELLANEOUS) ×2 IMPLANT
COVER MAYO STAND XLG (MISCELLANEOUS) IMPLANT
COVER TIP SHEARS 8 DVNC (MISCELLANEOUS) ×1 IMPLANT
DERMABOND ADVANCED .7 DNX12 (GAUZE/BANDAGES/DRESSINGS) ×1 IMPLANT
DRAIN CHANNEL 15F RND FF 3/16 (WOUND CARE) IMPLANT
DRAPE ARM DVNC X/XI (DISPOSABLE) ×4 IMPLANT
DRAPE COLUMN DVNC XI (DISPOSABLE) ×1 IMPLANT
DRAPE HALF SHEET 40X57 (DRAPES) ×1 IMPLANT
DRAPE INCISE IOBAN 66X45 STRL (DRAPES) ×1 IMPLANT
DRIVER NDL LRG 8 DVNC XI (INSTRUMENTS) ×1 IMPLANT
DRIVER NDLE LRG 8 DVNC XI (INSTRUMENTS) ×2
EVACUATOR SILICONE 100CC (DRAIN) IMPLANT
FORCEPS BPLR FENES DVNC XI (FORCEP) ×1 IMPLANT
FORCEPS PROGRASP DVNC XI (FORCEP) ×1 IMPLANT
GLOVE BIO SURGEON STRL SZ8 (GLOVE) ×2 IMPLANT
GLOVE BIOGEL PI IND STRL 7.0 (GLOVE) ×3 IMPLANT
GLOVE BIOGEL PI IND STRL 8 (GLOVE) ×2 IMPLANT
GLOVE SURG SS PI 7.5 STRL IVOR (GLOVE) ×1 IMPLANT
GOWN STRL REUS W/TWL LRG LVL3 (GOWN DISPOSABLE) ×2 IMPLANT
GOWN STRL REUS W/TWL XL LVL3 (GOWN DISPOSABLE) ×2 IMPLANT
GUIDEWIRE STR DUAL SENSOR (WIRE) IMPLANT
GUIDEWIRE STR ZIPWIRE 035X150 (MISCELLANEOUS) IMPLANT
IRRIGATION STRYKERFLOW (MISCELLANEOUS) ×1 IMPLANT
IRRIGATOR STRYKERFLOW (MISCELLANEOUS) ×1
KIT TURNOVER KIT A (KITS) IMPLANT
MANIFOLD NEPTUNE II (INSTRUMENTS) ×1 IMPLANT
NDL HYPO 21X1.5 SAFETY (NEEDLE) ×1 IMPLANT
NDL INSUFFLATION 14GA 120MM (NEEDLE) ×1 IMPLANT
NEEDLE HYPO 21X1.5 SAFETY (NEEDLE) ×1
NEEDLE INSUFFLATION 14GA 120MM (NEEDLE) ×1
PACK LAP CHOLE LZT030E (CUSTOM PROCEDURE TRAY) ×1 IMPLANT
PENCIL HANDSWITCHING (ELECTRODE) ×1 IMPLANT
POSITIONER HEAD 8X9X4 ADT (SOFTGOODS) ×1 IMPLANT
SCISSORS MNPLR CVD DVNC XI (INSTRUMENTS) ×1 IMPLANT
SEAL UNIV 5-12 XI (MISCELLANEOUS) ×4 IMPLANT
SET BASIN LINEN APH (SET/KITS/TRAYS/PACK) ×1 IMPLANT
SET TUBE SMOKE EVAC HIGH FLOW (TUBING) IMPLANT
SPONGE DRAIN TRACH 4X4 STRL 2S (GAUZE/BANDAGES/DRESSINGS) IMPLANT
STENT URET 6FRX28 CONTOUR (STENTS) IMPLANT
SUT ETHILON 3 0 PS 1 (SUTURE) IMPLANT
SUT MNCRL AB 4-0 PS2 18 (SUTURE) ×2 IMPLANT
SUT V-LOC 90 ABS 3-0 VLT V-20 (SUTURE) IMPLANT
SUT VICRYL 0 UR6 27IN ABS (SUTURE) ×1 IMPLANT
SYR 20ML LL LF (SYRINGE) ×1 IMPLANT
TRAY FOLEY MTR SLVR 16FR STAT (SET/KITS/TRAYS/PACK) ×1 IMPLANT
TROCAR Z-THAD FIOS HNDL 12X100 (TROCAR) ×1 IMPLANT
TROCAR Z-THREAD FIOS 5X100MM (TROCAR) IMPLANT
WATER STERILE IRR 1000ML POUR (IV SOLUTION) ×1 IMPLANT

## 2023-11-20 NOTE — Anesthesia Procedure Notes (Signed)
Procedure Name: Intubation Date/Time: 11/20/2023 12:34 PM  Performed by: Franco Nones, CRNAPre-anesthesia Checklist: Patient identified, Patient being monitored, Timeout performed, Emergency Drugs available and Suction available Patient Re-evaluated:Patient Re-evaluated prior to induction Oxygen Delivery Method: Circle system utilized Preoxygenation: Pre-oxygenation with 100% oxygen Induction Type: IV induction Ventilation: Mask ventilation without difficulty Laryngoscope Size: Mac and 3 Grade View: Grade I Tube type: Oral Tube size: 7.0 mm Number of attempts: 1 Airway Equipment and Method: Stylet Placement Confirmation: ETT inserted through vocal cords under direct vision, positive ETCO2 and breath sounds checked- equal and bilateral Secured at: 21 cm Tube secured with: Tape Dental Injury: Teeth and Oropharynx as per pre-operative assessment

## 2023-11-20 NOTE — Op Note (Addendum)
Preoperative diagnosis: Left UPJ obstruction  Postop diagnosis: Same  Procedure: 1.  Left robot assisted laparoscopic pyeloplasty 2.   Placement of a left 6x26 JJ ureteral stent   Attending: Wilkie Aye, MD  Assistant: Franky Macho, MD  Anesthesia: General  Estimated blood loss: 50 cc  Drains: 1. 16 French Foley catheter 2. Left 6x26 JJ ureteral stent 3. 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 There were dense adhesions and a kink in the ureter as it entered the renal pelvis.  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   After discussing treatment options patient decided to proceed with left robot assisted laparoscopic pyeloplasty.  Procedure in detail: Prior to procedure consent was obtained. Patient was brought to the operating room and briefing was done sure correct patient, correct procedure, correct site.  General anesthesia was administered patient was placed in right lateral decubitus position. A foley catheter was then placed. their abdomen and flank was then prepped and draped usual sterile fashion.  A Veress needle was used to obtain pneumoperitoneum.  Once pneumoperitoneum was reestablished to 15 mmHg we then placed a 8 mm camera port lateral to the umbilicus at the lateral edge of rectus.  We then proceeded to place 3 more robotic ports. We then placed an assistant port. We then docked the robot.   We then dissected along the white line of [place].  We then reflected the colon medially.  We then identified the psoas muscle.  Once this was done we traced it down to the iliac vessels and identified the ureter.  Once we identified the gonadal vein and ureter were then traced this to the renal pelvis. We identified a crossing vessel as the cause for the UPJ obstruction. There were dense adhesions and a kink in the ureter as  it entered the renal pelvis. We then sharply ligated the ureter and then the crossing vessel moved posterior to the renal pelvis. We then brought the stent into the operative field. We the spatulated the ureter on the posterior aspect. When the tapered the renal pelvis with a 4-0 Vicryl in a running fashion. We the attached the ureter to the renal pelvis with two 4-0 monocryls in an running fashion. We then places a JP drain in the left lower quadrant robot port. We then removed our instruments, undocked the robot, and released the pneumoperitoneum. The assistant was utilized for retraction, suction, passing suture and closing incisions. The remaining skin incisions were then subcuticularly closed with 4-0 Monocryl.  We then placed Dermabond over all the incisions.  This included the procedure which resulted by the patient.  Complications: None  Condition: Stable, x-rayed, transferred to PACU.  Plan: Patient is to be admitted overnight. The foley catheter will be removed in tomorrow. They will be started on a clear liquid diet POD#1. The stent will remain in place for 6 weeks

## 2023-11-20 NOTE — Transfer of Care (Signed)
Immediate Anesthesia Transfer of Care Note  Patient: Colleen Lopez  Procedure(s) Performed: XI ROBOTIC ASSISTED PYELOPLASTY with left ureteral stent placement (Left: Ureter)  Patient Location: PACU  Anesthesia Type:General  Level of Consciousness: awake and patient cooperative  Airway & Oxygen Therapy: Patient Spontanous Breathing and Patient connected to face mask  Post-op Assessment: Report given to RN and Post -op Vital signs reviewed and stable  Post vital signs: Reviewed and stable  Last Vitals:  Vitals Value Taken Time  BP 164/98 11/20/23 1530  Temp 36.8 C 11/20/23 1529  Pulse 121 11/20/23 1534  Resp 20 11/20/23 1534  SpO2 100 % 11/20/23 1534  Vitals shown include unfiled device data.  Last Pain:  Vitals:   11/20/23 1056  PainSc: 3       Patients Stated Pain Goal: 2 (11/20/23 1056)  Complications: No notable events documented.

## 2023-11-20 NOTE — H&P (Signed)
HPI: Colleen Lopez is a 17yo here for evaluation of left hydronephrosis. 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 They then underwent nephrostomy tube placement on 10/05/2023. Renogram on 10/18 showed 26% left renal split function. The patient has been having intermittent left flank pain for 5 years. No history of recurrent UTI. NO history of nephrolithiasis. No history of gross hematuria     PMH:     Past Medical History:  Diagnosis Date   Abdominal pain     ADHD (attention deficit hyperactivity disorder)     Asthma     Dysautonomia (HCC)     Pervasive developmental disorder     PONV (postoperative nausea and vomiting)     POTS (postural orthostatic tachycardia syndrome)     Vision abnormalities     Vomiting            Surgical History:      Past Surgical History:  Procedure Laterality Date   AREOLA/NIPPLE RECONSTRUCTION WITH GRAFT Bilateral 06/09/2023    Procedure: AREOLA/NIPPLE RECONSTRUCTION WITH GRAFT;  Surgeon: Glenna Fellows, MD;  Location: [facility];  Service: Plastics;  Laterality: Bilateral;   IR NEPHROSTOMY PLACEMENT LEFT   10/05/2023   septorhinoplasty   07/2020   SIMPLE MASTECTOMY WITH AXILLARY SENTINEL NODE BIOPSY Bilateral 06/09/2023    Procedure: SIMPLE MASTECTOMY;  Surgeon: Glenna Fellows, MD;  Location: [facility];  Service: Plastics;  Laterality: Bilateral;          Home Medications:  Allergies as of 10/11/2023   No Known Allergies         Medication List           Accurate as of October 11, 2023 11:59 PM. If you have any questions, ask your nurse or doctor.              albuterol 108 (90 Base) MCG/ACT inhaler Commonly known as: ProAir HFA Inhale 2 puffs into the lungs every 4 (four) hours as needed for wheezing or shortness of breath.    baclofen 10 MG tablet Commonly known as: LIORESAL Take 1 tablet by mouth  nightly at bedtime. May also take during the day every 8 hours as needed (maximum is 3 doses in 24 hours).    ISOtretinoin 40 MG capsule Commonly known as: ACCUTANE Take 80 mg by mouth 2 (two) times daily.    Lidocaine HCl 5 % Gel Apply 1 Application topically as needed.    meclizine 12.5 MG tablet Commonly known as: ANTIVERT Take 1 tablet (12.5 mg total) by mouth 3 (three) times daily as needed for dizziness.    medroxyPROGESTERone 150 MG/ML injection Commonly known as: DEPO-PROVERA Inject 1 mL (150 mg total) into the muscle every 3 (three) months.    methylphenidate 10 MG tablet Commonly known as: Ritalin Take 1 tablet (10 mg total) by mouth 2 (two) times daily with breakfast and lunch.    Spacer/Aero-Holding Rudean Curt 1 applicator by Does not apply route as needed.    testosterone cypionate 200 MG/ML injection Commonly known as: DEPOTESTOSTERONE CYPIONATE Inject 0.25 mLs (50 mg total) into the skin every 7 (seven) days.    Vitamin D (Ergocalciferol) 1.25 MG (50000 UNIT) Caps capsule Commonly known as: DRISDOL Take 1 capsule (50,000 Units total) by mouth every 7 (seven) days.             Allergies:  Allergies  No Known Allergies  Family History:      Family History  Problem Relation Age of Onset   Obesity Maternal Grandmother     Diabetes type II Maternal Grandmother     Hypertension Maternal Grandmother     COPD Maternal Grandmother     Hyperlipidemia Maternal Grandmother     Heart disease Maternal Grandmother     Aneurysm Maternal Grandfather     Early death Maternal Grandfather     Bipolar disorder Mother     Drug abuse Mother     Drug abuse Father     Alcohol abuse Father     ADD / ADHD Brother     Bipolar disorder Paternal Uncle     Migraines Neg Hx            Social History:  reports that he has never smoked. He has been exposed to tobacco smoke. He has never used smokeless tobacco. He reports current drug use. Drug: Marijuana. He reports  that he does not drink alcohol.   ROS: All other review of systems were reviewed and are negative except what is noted above in HPI   Physical Exam: BP 130/74   Pulse 96   Constitutional:  Alert and oriented, No acute distress. HEENT: [place] AT, moist mucus membranes.  Trachea midline, no masses. Cardiovascular: No clubbing, cyanosis, or edema. Respiratory: Normal respiratory effort, no increased work of breathing. GI: Abdomen is soft, nontender, nondistended, no abdominal masses GU: No CVA tenderness.  Lymph: No cervical or inguinal lymphadenopathy. Skin: No rashes, bruises or suspicious lesions. Neurologic: Grossly intact, no focal deficits, moving all 4 extremities. Psychiatric: Normal mood and affect.   Laboratory Data: Recent Labs       Lab Results  Component Value Date    WBC 7.4 10/05/2023    HGB 16.9 (H) 10/05/2023    HCT 48.6 (H) 10/05/2023    MCV 87.4 10/05/2023    PLT 278 10/05/2023        Recent Labs       Lab Results  Component Value Date    CREATININE 0.79 10/05/2023        Recent Labs  No results found for: "PSA"     Recent Labs       Lab Results  Component Value Date    TESTOSTERONE 564 (H) 07/04/2023        Recent Labs       Lab Results  Component Value Date    HGBA1C 5.3 09/06/2023        Urinalysis Labs (Brief)          Component Value Date/Time    COLORURINE YELLOW 02/27/2013 1318    APPEARANCEUR Clear 10/10/2023 1414    LABSPEC 1.020 02/27/2013 1318    PHURINE 8.5 (H) 02/27/2013 1318    GLUCOSEU Negative 10/10/2023 1414    HGBUR NEG 02/27/2013 1318    BILIRUBINUR Negative 10/10/2023 1414    KETONESUR negative 03/14/2023 1519    KETONESUR NEG 02/27/2013 1318    PROTEINUR Trace 10/10/2023 1414    PROTEINUR NEG 02/27/2013 1318    UROBILINOGEN 0.2 03/14/2023 1519    UROBILINOGEN 0.2 02/27/2013 1318    NITRITE Negative 10/10/2023 1414    NITRITE NEG 02/27/2013 1318    LEUKOCYTESUR Negative 10/10/2023 1414        Recent  Labs       Lab Results  Component Value Date    LABMICR Comment 10/10/2023    WBCUA 0-5 08/29/2023    LABEPIT 0-10 08/29/2023  BACTERIA None seen 08/29/2023        Pertinent Imaging: CT 09/26/2023: Images reviewed and discussed with the patient  No results found for this or any previous visit.   No results found for this or any previous visit.   No results found for this or any previous visit.   No results found for this or any previous visit.   Results for orders placed during the hospital encounter of 08/31/23   US RENAL   Narrative CLINICAL DATA:  Dysuria   EXAM: RENAL / URINARY TRACT ULTRASOUND COMPLETE   COMPARISON:  None Available.   FINDINGS: Right Kidney:   Renal measurements: 11.9 x 4.1 x 6.0 cm = volume: 153.4 mL. Echogenicity within normal limits. No mass or hydronephrosis visualized.   Left Kidney:   Renal measurements: 12.4 x 4.8 x 5.3 cm = volume: 164.0 mL. Marked left hydronephrosis with renal cortical thinning. No renal mass.   Bladder:   Appears normal for degree of bladder distention.   Other:   None.   IMPRESSION: Severe left hydronephrosis with left renal cortical thinning and atrophy.     Electronically Signed By: Annia Belt M.D. On: 08/31/2023 10:56   No valid procedures specified. No results found for this or any previous visit.   No results found for this or any previous visit.     Assessment & Plan:     1. Hydronephrosis of left kidney The risks/benefits/alternatives to left robotic pyeloplasty was explained to the patient and he understands and wishes to proceed with surgery

## 2023-11-20 NOTE — Anesthesia Preprocedure Evaluation (Signed)
Anesthesia Evaluation  Patient identified by MRN, date of birth, ID band Patient awake    Reviewed: Allergy & Precautions, H&P , NPO status , Patient's Chart, lab work & pertinent test results  History of Anesthesia Complications (+) PONV and history of anesthetic complications  Airway Mallampati: II   Neck ROM: full    Dental no notable dental hx. (+) Dental Advisory Given, Teeth Intact   Pulmonary asthma    breath sounds clear to auscultation       Cardiovascular Normal cardiovascular exam Rhythm:regular Rate:Normal  Postural orthostatic hypotension disorder   Neuro/Psych  PSYCHIATRIC DISORDERS      ADHDPervasive developmental disorder. dysautonomia  Neuromuscular disease    GI/Hepatic   Endo/Other    Renal/GU Renal disease     Musculoskeletal   Abdominal   Peds  Hematology   Anesthesia Other Findings   Reproductive/Obstetrics                             Anesthesia Physical Anesthesia Plan  ASA: 2  Anesthesia Plan: General   Post-op Pain Management: Dilaudid IV   Induction: Intravenous  PONV Risk Score and Plan: 4 or greater and Ondansetron, Dexamethasone, Midazolam and Treatment may vary due to age or medical condition  Airway Management Planned: Oral ETT  Additional Equipment: None  Intra-op Plan:   Post-operative Plan: Extubation in OR  Informed Consent: I have reviewed the patients History and Physical, chart, labs and discussed the procedure including the risks, benefits and alternatives for the proposed anesthesia with the patient or authorized representative who has indicated his/her understanding and acceptance.       Plan Discussed with: CRNA, Anesthesiologist and Surgeon  Anesthesia Plan Comments:        Anesthesia Quick Evaluation

## 2023-11-20 NOTE — Anesthesia Postprocedure Evaluation (Signed)
Anesthesia Post Note  Patient: Colleen Lopez  Procedure(s) Performed: XI ROBOTIC ASSISTED PYELOPLASTY with left ureteral stent placement (Left: Ureter)  Patient location during evaluation: PACU Anesthesia Type: General Level of consciousness: awake and alert Pain management: pain level controlled Vital Signs Assessment: post-procedure vital signs reviewed and stable Respiratory status: spontaneous breathing, nonlabored ventilation, respiratory function stable and patient connected to nasal cannula oxygen Cardiovascular status: blood pressure returned to baseline and stable Postop Assessment: no apparent nausea or vomiting Anesthetic complications: no   There were no known notable events for this encounter.   Last Vitals:  Vitals:   11/20/23 1600 11/20/23 1615  BP: (!) 158/84 (!) 144/83  Pulse: (!) 117 (!) 121  Resp: 18 20  Temp:    SpO2: 99% 100%    Last Pain:  Vitals:   11/20/23 1615  PainSc: 4                  Creedence Heiss L Tecla Mailloux

## 2023-11-21 DIAGNOSIS — N131 Hydronephrosis with ureteral stricture, not elsewhere classified: Secondary | ICD-10-CM | POA: Diagnosis not present

## 2023-11-21 LAB — CBC
HCT: 42.7 % (ref 36.0–46.0)
Hemoglobin: 14.8 g/dL (ref 12.0–15.0)
MCH: 30.5 pg (ref 26.0–34.0)
MCHC: 34.7 g/dL (ref 30.0–36.0)
MCV: 87.9 fL (ref 80.0–100.0)
Platelets: 276 10*3/uL (ref 150–400)
RBC: 4.86 MIL/uL (ref 3.87–5.11)
RDW: 11.8 % (ref 11.5–15.5)
WBC: 11.4 10*3/uL — ABNORMAL HIGH (ref 4.0–10.5)
nRBC: 0 % (ref 0.0–0.2)

## 2023-11-21 LAB — BASIC METABOLIC PANEL
Anion gap: 9 (ref 5–15)
BUN: 8 mg/dL (ref 6–20)
CO2: 21 mmol/L — ABNORMAL LOW (ref 22–32)
Calcium: 9.5 mg/dL (ref 8.9–10.3)
Chloride: 106 mmol/L (ref 98–111)
Creatinine, Ser: 0.73 mg/dL (ref 0.44–1.00)
GFR, Estimated: >60 mL/min (ref >60)
Glucose, Bld: 153 mg/dL — ABNORMAL HIGH (ref 70–99)
Potassium: 3.9 mmol/L (ref 3.5–5.1)
Sodium: 136 mmol/L (ref 135–145)

## 2023-11-21 MED ORDER — TAMSULOSIN HCL 0.4 MG PO CAPS: 0.4000 mg | ORAL_CAPSULE | Freq: Every day | ORAL | 0 refills | Status: DC

## 2023-11-21 MED ORDER — HYDROMORPHONE HCL 2 MG PO TABS: 2.0000 mg | ORAL_TABLET | ORAL | 0 refills | Status: DC | PRN

## 2023-11-21 MED ORDER — SENNA 8.6 MG PO TABS: 1.0000 | ORAL_TABLET | Freq: Every day | ORAL | 0 refills | Status: DC

## 2023-11-21 MED ORDER — OXYCODONE HCL 5 MG PO TABS
15.0000 mg | ORAL_TABLET | ORAL | Status: DC | PRN
  Administered 2023-11-21: 15 mg via ORAL
  Filled 2023-11-21: qty 3

## 2023-11-21 MED ORDER — KETOROLAC TROMETHAMINE 30 MG/ML IJ SOLN
30.0000 mg | Freq: Three times a day (TID) | INTRAMUSCULAR | Status: DC
  Administered 2023-11-21 (×2): 30 mg via INTRAVENOUS
  Filled 2023-11-21 (×2): qty 1

## 2023-11-21 MED ORDER — OXYBUTYNIN CHLORIDE 5 MG PO TABS: 5.0000 mg | ORAL_TABLET | Freq: Three times a day (TID) | ORAL | 1 refills | Status: DC | PRN

## 2023-11-21 MED ORDER — PHENAZOPYRIDINE HCL 100 MG PO TABS: 100.0000 mg | ORAL_TABLET | Freq: Three times a day (TID) | ORAL | 0 refills | Status: DC | PRN

## 2023-11-21 NOTE — Progress Notes (Signed)
Transition of Care Department [facility]) has reviewed patient and no other TOC needs have been identified at this time. We will continue to monitor patient advancement through interdisciplinary progression rounds. If new patient transition needs arise, please place a TOC consult.   11/21/23 0755  TOC Brief Assessment  Insurance and Status Reviewed  Patient has primary care physician Yes  Home environment has been reviewed Lives with grandfather.  Prior level of function: Independent.  Prior/Current Home Services No current home services  Social Determinants of Health Reivew SDOH reviewed no interventions necessary  Readmission risk has been reviewed Yes  Transition of care needs no transition of care needs at this time

## 2023-11-21 NOTE — Plan of Care (Signed)

## 2023-11-23 ENCOUNTER — Encounter (HOSPITAL_COMMUNITY): Payer: Self-pay | Admitting: Urology

## 2023-11-28 ENCOUNTER — Ambulatory Visit: Payer: MEDICAID | Admitting: Urology

## 2023-11-29 ENCOUNTER — Ambulatory Visit: Payer: MEDICAID | Admitting: Urology

## 2023-11-29 VITALS — BP 127/84 | HR 108

## 2023-11-29 DIAGNOSIS — N130 Hydronephrosis with ureteropelvic junction obstruction: Secondary | ICD-10-CM

## 2023-11-29 DIAGNOSIS — Q6211 Congenital occlusion of ureteropelvic junction: Secondary | ICD-10-CM

## 2023-11-29 LAB — URINALYSIS, ROUTINE W REFLEX MICROSCOPIC
Bilirubin, UA: NEGATIVE
Ketones, UA: NEGATIVE
Nitrite, UA: POSITIVE — AB
Specific Gravity, UA: 1.02 (ref 1.005–1.030)
Urobilinogen, Ur: 2 mg/dL — ABNORMAL HIGH (ref 0.2–1.0)
pH, UA: 8.5 — ABNORMAL HIGH (ref 5.0–7.5)

## 2023-11-29 LAB — MICROSCOPIC EXAMINATION: RBC, Urine: >30 /[HPF] — AB (ref 0–2)

## 2023-11-29 MED ORDER — GEMTESA 75 MG PO TABS: 1.0000 | ORAL_TABLET | Freq: Every day | ORAL | Status: DC

## 2023-11-29 MED ORDER — PHENAZOPYRIDINE HCL 100 MG PO TABS: 100.0000 mg | ORAL_TABLET | Freq: Three times a day (TID) | ORAL | 0 refills | Status: DC | PRN

## 2023-11-29 NOTE — Progress Notes (Signed)
11/29/2023 2:35 PM   [name] [birth year] [id number]  Referring provider: Gilmore Laroche, FNP [address] [place],  [place] [address]  Followup left UPJ obstruction  HPI: Mr Hemmingway is a 17yo here for followup after left UPJ obstruction. He is having intermittent bladder spasms and occasional burning with urination.    PMH: Past Medical History:  Diagnosis Date   Abdominal pain    ADHD (attention deficit hyperactivity disorder)    Asthma    Dysautonomia (HCC)    Pervasive developmental disorder    PONV (postoperative nausea and vomiting)    POTS (postural orthostatic tachycardia syndrome)    Vision abnormalities    Vomiting     Surgical History: Past Surgical History:  Procedure Laterality Date   AREOLA/NIPPLE RECONSTRUCTION WITH GRAFT Bilateral 06/09/2023   Procedure: AREOLA/NIPPLE RECONSTRUCTION WITH GRAFT;  Surgeon: Glenna Fellows, MD;  Location: [facility];  Service: Plastics;  Laterality: Bilateral;   IR NEPHROSTOMY PLACEMENT LEFT  10/05/2023   ROBOT ASSISTED PYELOPLASTY Left 11/20/2023   Procedure: XI ROBOTIC ASSISTED PYELOPLASTY with left ureteral stent placement;  Surgeon: Malen Gauze, MD;  Location: [facility];  Service: Urology;  Laterality: Left;   septorhinoplasty  07/2020   SIMPLE MASTECTOMY WITH AXILLARY SENTINEL NODE BIOPSY Bilateral 06/09/2023   Procedure: SIMPLE MASTECTOMY;  Surgeon: Glenna Fellows, MD;  Location: [facility];  Service: Plastics;  Laterality: Bilateral;    Home Medications:  Allergies as of 11/29/2023   No Known Allergies      Medication List        Accurate as of November 29, 2023  2:35 PM. If you have any questions, ask your nurse or doctor.          HYDROmorphone 2 MG tablet Commonly known as: Dilaudid Take 1 tablet (2 mg total) by mouth every 4 (four) hours as needed for severe pain (pain score 7-10).   medroxyPROGESTERone 150 MG/ML injection Commonly known as:  DEPO-PROVERA Inject 1 mL (150 mg total) into the muscle every 3 (three) months.   minoxidil 2.5 MG tablet Commonly known as: LONITEN Take one half tab by mouth daily What changed:  how much to take how to take this when to take this additional instructions   oxybutynin 5 MG tablet Commonly known as: DITROPAN Take 1 tablet (5 mg total) by mouth every 8 (eight) hours as needed for bladder spasms.   phenazopyridine 100 MG tablet Commonly known as: PYRIDIUM Take 1 tablet (100 mg total) by mouth 3 (three) times daily as needed for pain.   polyethylene glycol powder 17 GM/SCOOP powder Commonly known as: GLYCOLAX/MIRALAX Take 17 g by mouth daily as needed for moderate constipation.   senna 8.6 MG Tabs tablet Commonly known as: SENOKOT Take 1 tablet (8.6 mg total) by mouth daily.   tamsulosin 0.4 MG Caps capsule Commonly known as: Flomax Take 1 capsule (0.4 mg total) by mouth daily after supper.   testosterone cypionate 200 MG/ML injection Commonly known as: DEPOTESTOSTERONE CYPIONATE Inject 0.25 mLs (50 mg total) into the skin every 7 (seven) days.   Vitamin D (Ergocalciferol) 1.25 MG (50000 UNIT) Caps capsule Commonly known as: DRISDOL Take 1 capsule (50,000 Units total) by mouth every 7 (seven) days.        Allergies: No Known Allergies  Family History: Family History  Problem Relation Age of Onset   Obesity Maternal Grandmother    Diabetes type II Maternal Grandmother    Hypertension Maternal Grandmother  COPD Maternal Grandmother    Hyperlipidemia Maternal Grandmother    Heart disease Maternal Grandmother    Aneurysm Maternal Grandfather    Early death Maternal Grandfather    Bipolar disorder Mother    Drug abuse Mother    Drug abuse Father    Alcohol abuse Father    ADD / ADHD Brother    Bipolar disorder Paternal Uncle    Migraines Neg Hx     Social History:  reports that he has never smoked. He has been exposed to tobacco smoke. He has never used  smokeless tobacco. He reports current drug use. Drug: Marijuana. He reports that he does not drink alcohol.  ROS: All other review of systems were reviewed and are negative except what is noted above in HPI  Physical Exam: BP 127/84   Pulse (!) 108   Constitutional:  Alert and oriented, No acute distress. HEENT: [place] AT, moist mucus membranes.  Trachea midline, no masses. Cardiovascular: No clubbing, cyanosis, or edema. Respiratory: Normal respiratory effort, no increased work of breathing. GI: Abdomen is soft, nontender, nondistended, no abdominal masses GU: No CVA tenderness.  Lymph: No cervical or inguinal lymphadenopathy. Skin: No rashes, bruises or suspicious lesions. Neurologic: Grossly intact, no focal deficits, moving all 4 extremities. Psychiatric: Normal mood and affect.  Laboratory Data: Lab Results  Component Value Date   WBC 11.4 (H) 11/21/2023   HGB 14.8 11/21/2023   HCT 42.7 11/21/2023   MCV 87.9 11/21/2023   PLT 276 11/21/2023    Lab Results  Component Value Date   CREATININE 0.73 11/21/2023    No results found for: "PSA"  Lab Results  Component Value Date   TESTOSTERONE 564 (H) 07/04/2023    Lab Results  Component Value Date   HGBA1C 5.3 09/06/2023    Urinalysis    Component Value Date/Time   COLORURINE YELLOW 02/27/2013 1318   APPEARANCEUR Clear 10/10/2023 1414   LABSPEC 1.020 02/27/2013 1318   PHURINE 8.5 (H) 02/27/2013 1318   GLUCOSEU Negative 10/10/2023 1414   HGBUR NEG 02/27/2013 1318   BILIRUBINUR Negative 10/10/2023 1414   KETONESUR negative 03/14/2023 1519   KETONESUR NEG 02/27/2013 1318   PROTEINUR Trace 10/10/2023 1414   PROTEINUR NEG 02/27/2013 1318   UROBILINOGEN 0.2 03/14/2023 1519   UROBILINOGEN 0.2 02/27/2013 1318   NITRITE Negative 10/10/2023 1414   NITRITE NEG 02/27/2013 1318   LEUKOCYTESUR Negative 10/10/2023 1414    Lab Results  Component Value Date   LABMICR Comment 10/10/2023   WBCUA 0-5 08/29/2023   LABEPIT  0-10 08/29/2023   BACTERIA None seen 08/29/2023    Pertinent Imaging: *** No results found for this or any previous visit.  No results found for this or any previous visit.  No results found for this or any previous visit.  No results found for this or any previous visit.  Results for orders placed during the hospital encounter of 08/31/23  US RENAL  Narrative CLINICAL DATA:  Dysuria  EXAM: RENAL / URINARY TRACT ULTRASOUND COMPLETE  COMPARISON:  None Available.  FINDINGS: Right Kidney:  Renal measurements: 11.9 x 4.1 x 6.0 cm = volume: 153.4 mL. Echogenicity within normal limits. No mass or hydronephrosis visualized.  Left Kidney:  Renal measurements: 12.4 x 4.8 x 5.3 cm = volume: 164.0 mL. Marked left hydronephrosis with renal cortical thinning. No renal mass.  Bladder:  Appears normal for degree of bladder distention.  Other:  None.  IMPRESSION: Severe left hydronephrosis with left renal cortical thinning and atrophy.  Electronically Signed By: Annia Belt M.D. On: 08/31/2023 10:56  No valid procedures specified. No results found for this or any previous visit.  No results found for this or any previous visit.   Assessment & Plan:    1. Hydronephrosis with ureteropelvic junction (UPJ) obstruction -followup 1 month for stent removal - PR CHANGE CYSTOSTOMY TUBE SIMPLE   No follow-ups on file.  Wilkie Aye, MD  [facility] [place]

## 2023-11-29 NOTE — Patient Instructions (Signed)
Hydronephrosis  Hydronephrosis is the swelling of one or both kidneys due to a blockage that stops urine from flowing out of the body. Kidneys filter waste from the blood and produce urine. This condition can lead to kidney failure and may become life-threatening if not treated promptly. What are the causes? In infants and children, common causes include problems that occur when a baby is developing in the womb. These can include problems in the kidneys or in the tubes that drain urine into the bladder (ureters). In adults, common causes include: Kidney stones. Pregnancy. A tumor or cyst in the abdomen or pelvis. An enlarged prostate gland. Other causes include: Bladder infection. Scar tissue from a previous surgery or injury. A blood clot. Cancer of the prostate, bladder, uterus, ovary, or colon. What are the signs or symptoms? Symptoms of this condition include: Pain or discomfort in your side (flank) or abdomen. Swelling in your abdomen. Nausea and vomiting. Fever. Pain when passing urine. Feelings of urgency when you need to urinate. Urinating more often than normal. In some cases, you may not have any symptoms. How is this diagnosed? This condition may be diagnosed based on: Your symptoms and medical history. A physical exam. Blood and urine tests. Imaging tests, such as an ultrasound, CT scan, or MRI. A procedure to look at your urinary tract and bladder by inserting a scope into the urethra (cystoscopy). How is this treated? Treatment for this condition depends on where the blockage is, how long it has been there, and what caused it. The goal of treatment is to remove the blockage. Treatment may include: Antibiotic medicines to treat or prevent infection. A procedure to place a small, thin tube (stent) into a blocked ureter. The stent will keep the ureter open so that urine can drain through it. A nonsurgical procedure that crushes kidney stones with shock waves  (extracorporeal shock wave lithotripsy). If kidney failure occurs, treatment may include dialysis or a kidney transplant. Follow these instructions at home:  Take over-the-counter and prescription medicines only as told by your health care provider. If you were prescribed an antibiotic medicine, take it exactly as told by your health care provider. Do not stop taking the antibiotic even if you start to feel better. Rest and return to your normal activities as told by your health care provider. Ask your health care provider what activities are safe for you. Drink enough fluid to keep your urine pale yellow. Keep all follow-up visits. This is important. Contact a health care provider if: You continue to have symptoms after treatment. You develop new symptoms. Your urine becomes cloudy or bloody. You have a fever. Get help right away if: You have severe flank or abdominal pain. You cannot drink fluids without vomiting. Summary Hydronephrosis is the swelling of one or both kidneys due to a blockage that stops urine from flowing out of the body. Hydronephrosis can lead to kidney failure and may become life-threatening if not treated promptly. The goal of treatment is to remove the blockage. It may include a procedure to insert a stent into a blocked ureter, a procedure to break up kidney stones, or taking antibiotic medicines. Follow your health care provider's instructions for taking care of yourself at home, including instructions about drinking fluids, taking medicines, and limiting activities. This information is not intended to replace advice given to you by your health care provider. Make sure you discuss any questions you have with your health care provider. Document Revised: 03/24/2020 Document Reviewed: 03/24/2020 Elsevier   Patient Education  2024 [employer]  

## 2023-11-30 ENCOUNTER — Encounter: Payer: Self-pay | Admitting: Urology

## 2023-11-30 NOTE — Telephone Encounter (Signed)
Please see pt message below.  No culture sent yesterday.

## 2023-12-01 NOTE — Telephone Encounter (Signed)
I tried to reach patient by phone, verbal from Dr. Ronne Binning, to have patient take gemtesa and oxybutynin.  I could not confirm with patient if they were taking rx's but both were sent to patient pharmacy.  Will notify patient via mychart.

## 2023-12-04 ENCOUNTER — Telehealth: Payer: Self-pay | Admitting: Urology

## 2023-12-04 NOTE — Telephone Encounter (Signed)
Please advise on if patient needs a urine culture.

## 2023-12-04 NOTE — Telephone Encounter (Signed)
Patient states he missed a call and is still in severe pain

## 2023-12-04 NOTE — Telephone Encounter (Signed)
Please see telephone encounter

## 2023-12-04 NOTE — Telephone Encounter (Signed)
Please see below.  Will you send in another pain medication.

## 2023-12-04 NOTE — Telephone Encounter (Signed)
Returned call to patient and patient states they went to the ER.

## 2023-12-06 ENCOUNTER — Ambulatory Visit: Payer: MEDICAID

## 2023-12-07 ENCOUNTER — Encounter (INDEPENDENT_AMBULATORY_CARE_PROVIDER_SITE_OTHER): Payer: Self-pay | Admitting: Pediatric Endocrinology

## 2023-12-08 NOTE — Telephone Encounter (Signed)
Rx already at pharmacy. MD notified rx was not sent

## 2023-12-09 ENCOUNTER — Encounter: Payer: Self-pay | Admitting: Urology

## 2023-12-18 NOTE — Progress Notes (Signed)
 Name: Colleen Lopez DOB: Jan 23, 2006 MRN: [id number]  History of Present Illness: [name] is a 17 y.o. adult who presents today for follow up visit at [facility].  GU history:  1. 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  2. 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 Present on imaging dating back to at least 04/09/2013 (age 63).  3. LUTS (dysuria, frequency, urgency) x1 year in the setting of negative urine cultures.  - No past history of kidney stones. 4. Microscopic hematuria. - 05/23/2023: Urine microscopy showed 6-10 WBC/hpf, >30 RBC/hpf, and few bacteria. Urine culture was negative.  - Minimal risk factors for malignancy.   Recent history:  > 10/05/2023: Left percutaneous nephrostomy tube place by IR.  > 10/06/2023: Lasix  renogram showed high-grade obstruction of the left kidney (differential: left kidney = 26%; right kidney = 74%).  > 10/20/2023: Urine culture positive for Enterococcus faecalis.  > 11/20/2023:  - Underwent left robot assisted laparoscopic pyeloplasty and left ureteral stent placement by Dr. Sherrilee.   > 11/29/2023:  - Postop visit with Dr. Sherrilee.  - Patient having intermittent bladder spasms and occasional dysuria. - Gemtesa  75 mg daily & Pyridium  prescribed. - The plan was followup 1 month for stent removal (scheduled on 12/27/2023 with Dr. Sherrilee).  > 12/02/2023:  - Seen in ER for dysuria, subjective fever, and nausea x3 days.  CT showed no acute findings. CMP with normal renal function (GFR >60; creatinine 1.04). CBC with no leukocytosis (WBC 7.7). UA showed 10 WBC/hpf, 50 RBC/hpf, bacteria (occasional). Treated with IV Rocephin  1 gram for UTI.  Urine culture came back positive for 50-100k Enterococcus faecalis and 10-50k Streptococcus mitis group.  Antibiotic prescribed: Cefdinir (Omnicef) 300 mg 2x/day x10 days. Per patient that was later changed to a different antibiotic based on urine culture  sensitivity report (unclear which medication).  Today: He reports ongoing left flank pain, dysuria, bladder pain, night sweats (new - not suspected to be related to his testosterone  use which he's been on for >1 year), nausea, new urinary incontinence.   He is concerned about the functionality of his left kidney and would like to know if there is evidence of long term damage on that side.    Fall Screening: Do you usually have a device to assist in your mobility? No   Medications: Current Outpatient Medications  Medication Sig Dispense Refill   minoxidil  (LONITEN ) 2.5 MG tablet Take one half tab by mouth daily (Patient taking differently: Take 2.5 mg by mouth daily.) 45 tablet 1   nitrofurantoin , macrocrystal-monohydrate, (MACROBID ) 100 MG capsule Take 1 capsule (100 mg total) by mouth 2 (two) times daily for 14 days. 28 capsule 0   oxybutynin  (DITROPAN ) 5 MG tablet Take 1 tablet (5 mg total) by mouth every 8 (eight) hours as needed for bladder spasms. 60 tablet 1   oxyCODONE -acetaminophen  (PERCOCET) 10-325 MG tablet Take 1 tablet by mouth every 4 (four) hours as needed for pain. 30 tablet 0   polyethylene glycol powder (GLYCOLAX/MIRALAX) 17 GM/SCOOP powder Take 17 g by mouth daily as needed for moderate constipation.     senna (SENOKOT) 8.6 MG TABS tablet Take 1 tablet (8.6 mg total) by mouth daily. 120 tablet 0   tamsulosin  (FLOMAX ) 0.4 MG CAPS capsule Take 1 capsule (0.4 mg total) by mouth daily after supper. 30 capsule 0   testosterone  cypionate (DEPOTESTOSTERONE CYPIONATE) 200 MG/ML injection Inject 0.25 mLs (50 mg total) into the skin every 7 (seven) days. 4 mL  3   Vibegron  (GEMTESA ) 75 MG TABS Take 1 tablet (75 mg total) by mouth daily.     Vitamin D , Ergocalciferol , (DRISDOL ) 1.25 MG (50000 UNIT) CAPS capsule Take 1 capsule (50,000 Units total) by mouth every 7 (seven) days. 20 capsule 1   medroxyPROGESTERone  (DEPO-PROVERA ) 150 MG/ML injection Inject 1 mL (150 mg total) into the  muscle every 3 (three) months. 1 mL 4   phenazopyridine  (PYRIDIUM ) 100 MG tablet Take 1 tablet (100 mg total) by mouth 3 (three) times daily as needed for pain. 20 tablet 0   No current facility-administered medications for this visit.    Allergies: No Known Allergies  Past Medical History:  Diagnosis Date   Abdominal pain    ADHD (attention deficit hyperactivity disorder)    Asthma    Dysautonomia (HCC)    Pervasive developmental disorder    PONV (postoperative nausea and vomiting)    POTS (postural orthostatic tachycardia syndrome)    Vision abnormalities    Vomiting    Past Surgical History:  Procedure Laterality Date   AREOLA/NIPPLE RECONSTRUCTION WITH GRAFT Bilateral 06/09/2023   Procedure: AREOLA/NIPPLE RECONSTRUCTION WITH GRAFT;  Surgeon: Arelia Filippo, MD;  Location: [facility];  Service: Plastics;  Laterality: Bilateral;   IR NEPHROSTOMY PLACEMENT LEFT  10/05/2023   ROBOT ASSISTED PYELOPLASTY Left 11/20/2023   Procedure: XI ROBOTIC ASSISTED PYELOPLASTY with left ureteral stent placement;  Surgeon: Sherrilee Belvie CROME, MD;  Location: [facility];  Service: Urology;  Laterality: Left;   septorhinoplasty  07/2020   SIMPLE MASTECTOMY WITH AXILLARY SENTINEL NODE BIOPSY Bilateral 06/09/2023   Procedure: SIMPLE MASTECTOMY;  Surgeon: Arelia Filippo, MD;  Location: [facility];  Service: Plastics;  Laterality: Bilateral;   Family History  Problem Relation Age of Onset   Obesity Maternal Grandmother    Diabetes type II Maternal Grandmother    Hypertension Maternal Grandmother    COPD Maternal Grandmother    Hyperlipidemia Maternal Grandmother    Heart disease Maternal Grandmother    Aneurysm Maternal Grandfather    Early death Maternal Grandfather    Bipolar disorder Mother    Drug abuse Mother    Drug abuse Father    Alcohol abuse Father    ADD / ADHD Brother    Bipolar disorder Paternal Uncle    Migraines Neg Hx    Social History    Socioeconomic History   Marital status: Significant Other    Spouse name: Not on file   Number of children: Not on file   Years of education: Not on file   Highest education level: Not on file  Occupational History   Not on file  Tobacco Use   Smoking status: Never    Passive exposure: Yes   Smokeless tobacco: Never  Vaping Use   Vaping status: Never Used  Substance and Sexual Activity   Alcohol use: No   Drug use: Yes    Types: Marijuana    Comment: 3 days ago   Sexual activity: Never  Other Topics Concern   Not on file  Social History Narrative   ** Merged History Encounter **       Lives at home with grandmother and step grandfather, aunt and two half brothers. 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      Lives with Priscilla and 2 half brothers.    He is going to work towards getting GED   He enjoys  playing bass, (electric base - guitar)  drawing, and fantasy world building.       Getting his GED. 24-25 school year   Social Drivers of Corporate Investment Banker Strain: Not on file  Food Insecurity: No Food Insecurity (11/20/2023)   Hunger Vital Sign    Worried About Running Out of Food in the Last Year: Never true    Ran Out of Food in the Last Year: Never true  Transportation Needs: No Transportation Needs (11/20/2023)   PRAPARE - Administrator, Civil Service (Medical): No    Lack of Transportation (Non-Medical): No  Physical Activity: Not on file  Stress: Not on file  Social Connections: Not on file  Intimate Partner Violence: Not At Risk (11/20/2023)   Humiliation, Afraid, Rape, and Kick questionnaire    Fear of Current or Ex-Partner: No    Emotionally Abused: No    Physically Abused: No    Sexually Abused: No    Review of Systems Constitutional: Patient denies any unintentional weight loss or change in strength lntegumentary: Patient denies any rashes or pruritus Cardiovascular:  Patient denies chest pain or syncope Respiratory: Patient denies shortness of breath Gastrointestinal: Patient denies vomiting, constipation, or diarrhea Musculoskeletal: Patient denies muscle cramps or weakness Neurologic: Patient denies convulsions or seizures Allergic/Immunologic: Patient denies recent allergic reaction(s) Hematologic/Lymphatic: Patient denies bleeding tendencies Endocrine: Patient denies heat/cold intolerance  GU: As per HPI.  OBJECTIVE Vitals:   12/21/23 1310  BP: 131/78  Pulse: (!) 111   There is no height or weight on file to calculate BMI.  Physical Examination Constitutional: No obvious distress; patient is non-toxic appearing  Cardiovascular: No visible lower extremity edema.  Respiratory: The patient does not have audible wheezing/stridor; respirations do not appear labored  Gastrointestinal: Abdomen non-distended Musculoskeletal: Normal ROM of UEs  Skin: No obvious rashes/open sores  Neurologic: CN 2-12 grossly intact Psychiatric: Answered questions appropriately with normal affect  Hematologic/Lymphatic/Immunologic: No obvious bruises or sites of spontaneous bleeding  Urine microscopy: 6-10 WBC/hpf, >30 RBC/hpf, few bacteria PVR: 0 ml  ASSESSMENT Recurrent UTI - Plan: Urinalysis, Routine w reflex microscopic, BLADDER SCAN AMB NON-IMAGING  Hydronephrosis with ureteropelvic junction (UPJ) obstruction - Plan: NM Renal Imaging Flow W/Pharm, oxyCODONE -acetaminophen  (PERCOCET) 10-325 MG tablet, Urine Culture  Lower urinary tract symptoms (LUTS) - Plan: oxyCODONE -acetaminophen  (PERCOCET) 10-325 MG tablet, Urine Culture  Ureteral stent present - Plan: oxyCODONE -acetaminophen  (PERCOCET) 10-325 MG tablet, Urine Culture  UTI symptoms - Plan: phenazopyridine  (PYRIDIUM ) 100 MG tablet, nitrofurantoin , macrocrystal-monohydrate, (MACROBID ) 100 MG capsule, Urine Culture  Postop check - Plan: NM Renal Imaging Flow W/Pharm, oxyCODONE -acetaminophen  (PERCOCET)  10-325 MG tablet, phenazopyridine  (PYRIDIUM ) 100 MG tablet, nitrofurantoin , macrocrystal-monohydrate, (MACROBID ) 100 MG capsule, Urine Culture  We discussed his situation in detail.   For relapsing UTI will repeat urine culture and we agreed to treat empirically starting today with extended course of Macrobid  (100 mg 2x/day x14 days) while awaiting culture results and sensitivities.  For pain management refill sent for Percocet for PRN use along with Pyridium , which he was advised to use sparingly due to risk for liver injury, methemoglobinemia, and damage to bone health with long term / persistent use.  He was advised that his new urinary incontinence is likely due to ureteral stent presence and is expected to resolve once that is removed next week on 12/27/2023 by Dr. Sherrilee. May continue Gemtesa  75 mg daily for now to reduce bladder / ureteral spasms.  He was reassured that GFR was normal on 12/02/2023.  We agreed to repeat Lasix  renogram approximately 4 weeks after left ureteral stent removal to reassess left kidney function s/p pyeloplasty due to the chronicity of his prior left hydronephrosis.   Pt verbalized understanding and agreement. All questions were answered.   PLAN Advised the following: 1. Urine culture. 2. Macrobid  (Nitrofurantoin ) 100 mg 2x/day x14 days. 3. Percocet PRN for pain. 4. Pyridium  PRN for dysuria. 6. Return in 6 days (on 12/27/2023) for f/u with Dr. Sherrilee as previously scheduled. 7. Follow up with urology NP in 6 weeks following Lasix  renogram.   Orders Placed This Encounter  Procedures   Urine Culture   NM Renal Imaging Flow W/Pharm    Standing Status:   Future    Expected Date:   01/21/2024    Expiration Date:   12/20/2024    If indicated for the ordered procedure, I authorize the administration of a radiopharmaceutical per Radiology protocol:   Yes    Is the patient pregnant?:   No    Preferred imaging location?:   [facility]   Urinalysis,  Routine w reflex microscopic   BLADDER SCAN AMB NON-IMAGING    It has been explained that the patient is to follow regularly with their PCP in addition to all other providers involved in their care and to follow instructions provided by these respective offices. Patient advised to contact urology clinic if any urologic-pertaining questions, concerns, new symptoms or problems arise in the interim period.  There are no Patient Instructions on file for this visit.  Electronically signed by:  Lauraine JAYSON Oz, FNP   12/21/23    2:18 PM

## 2023-12-18 NOTE — Telephone Encounter (Signed)
Please see pt message below, please refill pain medication if appropriate.

## 2023-12-21 ENCOUNTER — Encounter: Payer: Self-pay | Admitting: Urology

## 2023-12-21 ENCOUNTER — Ambulatory Visit (INDEPENDENT_AMBULATORY_CARE_PROVIDER_SITE_OTHER): Payer: MEDICAID | Admitting: Urology

## 2023-12-21 VITALS — BP 131/78 | HR 111

## 2023-12-21 DIAGNOSIS — N130 Hydronephrosis with ureteropelvic junction obstruction: Secondary | ICD-10-CM

## 2023-12-21 DIAGNOSIS — Q6211 Congenital occlusion of ureteropelvic junction: Secondary | ICD-10-CM

## 2023-12-21 DIAGNOSIS — N390 Urinary tract infection, site not specified: Secondary | ICD-10-CM

## 2023-12-21 DIAGNOSIS — R399 Unspecified symptoms and signs involving the genitourinary system: Secondary | ICD-10-CM

## 2023-12-21 DIAGNOSIS — Z960 Presence of urogenital implants: Secondary | ICD-10-CM

## 2023-12-21 DIAGNOSIS — Z09 Encounter for follow-up examination after completed treatment for conditions other than malignant neoplasm: Secondary | ICD-10-CM

## 2023-12-21 LAB — MICROSCOPIC EXAMINATION: RBC, Urine: >30 /[HPF] — AB (ref 0–2)

## 2023-12-21 LAB — URINALYSIS, ROUTINE W REFLEX MICROSCOPIC
Bilirubin, UA: NEGATIVE
Glucose, UA: NEGATIVE
Ketones, UA: NEGATIVE
Nitrite, UA: POSITIVE — AB
Specific Gravity, UA: 1.025 (ref 1.005–1.030)
Urobilinogen, Ur: 1 mg/dL (ref 0.2–1.0)
pH, UA: 7.5 (ref 5.0–7.5)

## 2023-12-21 LAB — BLADDER SCAN AMB NON-IMAGING: Scan Result: 0

## 2023-12-21 MED ORDER — PHENAZOPYRIDINE HCL 100 MG PO TABS
100.0000 mg | ORAL_TABLET | Freq: Three times a day (TID) | ORAL | 0 refills | Status: DC | PRN
Start: 2023-12-21 — End: 2024-01-17

## 2023-12-21 MED ORDER — OXYCODONE-ACETAMINOPHEN 10-325 MG PO TABS: 1.0000 | ORAL_TABLET | ORAL | 0 refills | Status: DC | PRN

## 2023-12-21 MED ORDER — NITROFURANTOIN MONOHYD MACRO 100 MG PO CAPS: 100.0000 mg | ORAL_CAPSULE | Freq: Two times a day (BID) | ORAL | 0 refills | Status: AC

## 2023-12-21 NOTE — Progress Notes (Signed)
 post void residual=0 ?

## 2023-12-24 LAB — URINE CULTURE

## 2023-12-27 ENCOUNTER — Telehealth: Payer: Self-pay | Admitting: Urology

## 2023-12-27 ENCOUNTER — Ambulatory Visit: Payer: MEDICAID | Admitting: Family Medicine

## 2023-12-27 ENCOUNTER — Ambulatory Visit: Payer: MEDICAID | Admitting: Urology

## 2023-12-27 VITALS — BP 172/129 | HR 139

## 2023-12-27 DIAGNOSIS — Q6211 Congenital occlusion of ureteropelvic junction: Secondary | ICD-10-CM

## 2023-12-27 DIAGNOSIS — Z960 Presence of urogenital implants: Secondary | ICD-10-CM

## 2023-12-27 DIAGNOSIS — R399 Unspecified symptoms and signs involving the genitourinary system: Secondary | ICD-10-CM

## 2023-12-27 DIAGNOSIS — Z466 Encounter for fitting and adjustment of urinary device: Secondary | ICD-10-CM

## 2023-12-27 DIAGNOSIS — Z09 Encounter for follow-up examination after completed treatment for conditions other than malignant neoplasm: Secondary | ICD-10-CM

## 2023-12-27 LAB — URINALYSIS, ROUTINE W REFLEX MICROSCOPIC
Bilirubin, UA: NEGATIVE
Ketones, UA: NEGATIVE
Nitrite, UA: POSITIVE — AB
Specific Gravity, UA: 1.025 (ref 1.005–1.030)
Urobilinogen, Ur: 4 mg/dL — ABNORMAL HIGH (ref 0.2–1.0)
pH, UA: 6.5 (ref 5.0–7.5)

## 2023-12-27 LAB — MICROSCOPIC EXAMINATION: RBC, Urine: >30 /[HPF] — AB (ref 0–2)

## 2023-12-27 MED ORDER — CIPROFLOXACIN HCL 500 MG PO TABS
500.0000 mg | ORAL_TABLET | Freq: Once | ORAL | Status: AC
  Administered 2023-12-27: 500 mg via ORAL

## 2023-12-27 MED ORDER — OXYCODONE-ACETAMINOPHEN 10-325 MG PO TABS
1.0000 | ORAL_TABLET | ORAL | 0 refills | Status: DC | PRN
Start: 2023-12-27 — End: 2024-01-17

## 2023-12-27 NOTE — Telephone Encounter (Signed)
 RX needs prior authorization

## 2023-12-27 NOTE — Telephone Encounter (Signed)
 [facility] PA initiated call back is [phone]

## 2023-12-27 NOTE — Telephone Encounter (Signed)
 PA approved- Ref # [id number]

## 2023-12-27 NOTE — Progress Notes (Signed)
   12/27/23  CC: followup after pyeloplasty   HPI:  Blood pressure (!) 172/129, pulse (!) 139. NED. A&Ox3.   No respiratory distress   Abd soft, NT, ND Normal external genitalia with patent urethral meatus  Cystoscopy Procedure Note  Patient identification was confirmed, informed consent was obtained, and patient was prepped using Betadine solution.  Lidocaine  jelly was administered per urethral meatus.    Procedure: - Flexible cystoscope introduced, without any difficulty.   - Thorough search of the bladder revealed:    normal urethral meatus    normal urothelium    no stones    no ulcers     no tumors    no urethral polyps    no trabeculation  - Ureteral orifices were normal in position and appearance. Using a grasper the left ureteral stent was removed intact  Post-Procedure: - Patient tolerated the procedure well  Assessment/ Plan: Followup 6 weeks with a renal US    No follow-ups on file.  Belvie Clara, MD

## 2023-12-29 ENCOUNTER — Encounter: Payer: Self-pay | Admitting: Family Medicine

## 2024-01-01 MED ORDER — TESTOSTERONE CYPIONATE 200 MG/ML IM SOLN: 50.0000 mg | INTRAMUSCULAR | 3 refills | Status: DC

## 2024-01-01 MED ORDER — MINOXIDIL 2.5 MG PO TABS: ORAL_TABLET | ORAL | 1 refills | Status: DC

## 2024-01-02 NOTE — Patient Instructions (Signed)
Hydronephrosis  Hydronephrosis is the swelling of one or both kidneys due to a blockage that stops urine from flowing out of the body. Kidneys filter waste from the blood and produce urine. This condition can lead to kidney failure and may become life-threatening if not treated promptly. What are the causes? In infants and children, common causes include problems that occur when a baby is developing in the womb. These can include problems in the kidneys or in the tubes that drain urine into the bladder (ureters). In adults, common causes include: Kidney stones. Pregnancy. A tumor or cyst in the abdomen or pelvis. An enlarged prostate gland. Other causes include: Bladder infection. Scar tissue from a previous surgery or injury. A blood clot. Cancer of the prostate, bladder, uterus, ovary, or colon. What are the signs or symptoms? Symptoms of this condition include: Pain or discomfort in your side (flank) or abdomen. Swelling in your abdomen. Nausea and vomiting. Fever. Pain when passing urine. Feelings of urgency when you need to urinate. Urinating more often than normal. In some cases, you may not have any symptoms. How is this diagnosed? This condition may be diagnosed based on: Your symptoms and medical history. A physical exam. Blood and urine tests. Imaging tests, such as an ultrasound, CT scan, or MRI. A procedure to look at your urinary tract and bladder by inserting a scope into the urethra (cystoscopy). How is this treated? Treatment for this condition depends on where the blockage is, how long it has been there, and what caused it. The goal of treatment is to remove the blockage. Treatment may include: Antibiotic medicines to treat or prevent infection. A procedure to place a small, thin tube (stent) into a blocked ureter. The stent will keep the ureter open so that urine can drain through it. A nonsurgical procedure that crushes kidney stones with shock waves  (extracorporeal shock wave lithotripsy). If kidney failure occurs, treatment may include dialysis or a kidney transplant. Follow these instructions at home:  Take over-the-counter and prescription medicines only as told by your health care provider. If you were prescribed an antibiotic medicine, take it exactly as told by your health care provider. Do not stop taking the antibiotic even if you start to feel better. Rest and return to your normal activities as told by your health care provider. Ask your health care provider what activities are safe for you. Drink enough fluid to keep your urine pale yellow. Keep all follow-up visits. This is important. Contact a health care provider if: You continue to have symptoms after treatment. You develop new symptoms. Your urine becomes cloudy or bloody. You have a fever. Get help right away if: You have severe flank or abdominal pain. You cannot drink fluids without vomiting. Summary Hydronephrosis is the swelling of one or both kidneys due to a blockage that stops urine from flowing out of the body. Hydronephrosis can lead to kidney failure and may become life-threatening if not treated promptly. The goal of treatment is to remove the blockage. It may include a procedure to insert a stent into a blocked ureter, a procedure to break up kidney stones, or taking antibiotic medicines. Follow your health care provider's instructions for taking care of yourself at home, including instructions about drinking fluids, taking medicines, and limiting activities. This information is not intended to replace advice given to you by your health care provider. Make sure you discuss any questions you have with your health care provider. Document Revised: 03/24/2020 Document Reviewed: 03/24/2020 Elsevier   Patient Education  2024 [employer]  

## 2024-01-10 ENCOUNTER — Ambulatory Visit: Payer: MEDICAID | Admitting: Family Medicine

## 2024-01-11 ENCOUNTER — Ambulatory Visit (HOSPITAL_COMMUNITY)
Admission: RE | Admit: 2024-01-11 | Discharge: 2024-01-11 | Disposition: A | Discharge status: Home / Self Care | Payer: MEDICAID | Source: Ambulatory Visit | Attending: Urology | Admitting: Urology

## 2024-01-11 ENCOUNTER — Encounter (HOSPITAL_COMMUNITY): Payer: Self-pay

## 2024-01-11 DIAGNOSIS — Q6211 Congenital occlusion of ureteropelvic junction: Secondary | ICD-10-CM | POA: Insufficient documentation

## 2024-01-11 DIAGNOSIS — Z09 Encounter for follow-up examination after completed treatment for conditions other than malignant neoplasm: Secondary | ICD-10-CM | POA: Insufficient documentation

## 2024-01-11 MED ORDER — TECHNETIUM TC 99M MERTIATIDE
5.2000 | Freq: Once | INTRAVENOUS | Status: AC | PRN
  Administered 2024-01-11: 5.2 via INTRAVENOUS

## 2024-01-11 MED ORDER — FUROSEMIDE 10 MG/ML IJ SOLN
INTRAMUSCULAR | Status: AC
Start: 2024-01-11 — End: ?
  Filled 2024-01-11: qty 4

## 2024-01-11 MED ORDER — FUROSEMIDE 10 MG/ML IJ SOLN
36.0000 mg | Freq: Once | INTRAMUSCULAR | Status: AC
  Administered 2024-01-11: 36 mg via INTRAVENOUS

## 2024-01-12 ENCOUNTER — Other Ambulatory Visit: Payer: Self-pay | Admitting: Urology

## 2024-01-12 DIAGNOSIS — Q6211 Congenital occlusion of ureteropelvic junction: Secondary | ICD-10-CM

## 2024-01-12 NOTE — Telephone Encounter (Signed)
FYI and advise

## 2024-01-15 ENCOUNTER — Ambulatory Visit: Payer: MEDICAID | Admitting: Family Medicine

## 2024-01-17 ENCOUNTER — Ambulatory Visit: Payer: MEDICAID | Admitting: Family Medicine

## 2024-01-17 ENCOUNTER — Encounter: Payer: Self-pay | Admitting: Family Medicine

## 2024-01-17 VITALS — BP 129/82 | HR 121 | Temp 98.2°F | Wt 150.6 lb

## 2024-01-17 DIAGNOSIS — Z23 Encounter for immunization: Secondary | ICD-10-CM

## 2024-01-17 DIAGNOSIS — G901 Familial dysautonomia [Riley-Day]: Secondary | ICD-10-CM

## 2024-01-17 DIAGNOSIS — Z789 Other specified health status: Secondary | ICD-10-CM

## 2024-01-17 DIAGNOSIS — N1330 Unspecified hydronephrosis: Secondary | ICD-10-CM | POA: Diagnosis not present

## 2024-01-17 MED ORDER — TESTOSTERONE CYPIONATE 200 MG/ML IM SOLN: 50.0000 mg | INTRAMUSCULAR | 5 refills | Status: DC

## 2024-01-17 MED ORDER — MINOXIDIL 2.5 MG PO TABS: ORAL_TABLET | ORAL | 3 refills | Status: DC

## 2024-01-17 NOTE — Patient Instructions (Signed)
We updated your vaccines today and drew some lab work.  I will send you a note in my chart to get the labs back.  I sent in some refills.  Great to see you!

## 2024-01-18 ENCOUNTER — Encounter: Payer: Self-pay | Admitting: Family Medicine

## 2024-01-18 ENCOUNTER — Other Ambulatory Visit: Payer: Self-pay | Admitting: Allergy & Immunology

## 2024-01-19 NOTE — Progress Notes (Signed)
    CHIEF COMPLAINT / HPI: Follow-up gender affirming hormone therapy.  Happy with current transition.  Would like to get some blood work today as we had to delay it at last office visit. #2: Receding hairline: Feels like it has improved somewhat with medication therapy wants to continue that. 3.  Questions about healthcare screening and what he would need.  Expresses desire to be really healthy. #4.  Doing well with dysautonomia: No significant episodes.  PERTINENT  PMH / PSH: I have reviewed the patient's medications, allergies, past medical and surgical history, smoking status and updated in the EMR as appropriate.   OBJECTIVE:  BP 129/82   Pulse (!) 121   Temp 98.2 F (36.8 C)   Wt 150 lb 9.6 oz (68.3 kg)   SpO2 97%   BMI 25.06 kg/m  Vital signs reviewed. GENERAL: Well-developed, well-nourished, no acute distress. CV: Regular rate and rhythm NEURO: No gross focal neurological deficits. MSK: Movement of extremity x 4.   ASSESSMENT / PLAN:   Female-to-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 Check some labs today and continue gender affirming hormone therapy.  Will make any adjustments based on laboratory results.  Follow-up 3 months, sooner with problems.  Refills given.  Dysautonomia (HCC) Currently stable not having any significant episodes.  Hydronephrosis of left kidney Had successful surgical intervention and has completed postop follow-up.  He is hopeful that there will not be any future need for other interventions although they did tell him that there would likely be some mild chronic underlying renal damage.  Reviewed his postop labs which were normal   Denny Levy MD

## 2024-01-19 NOTE — Assessment & Plan Note (Signed)
Check some labs today and continue gender affirming hormone therapy.  Will make any adjustments based on laboratory results.  Follow-up 3 months, sooner with problems.  Refills given.

## 2024-01-19 NOTE — Assessment & Plan Note (Signed)
Currently stable not having any significant episodes.

## 2024-01-19 NOTE — Assessment & Plan Note (Signed)
Had successful surgical intervention and has completed postop follow-up.  He is hopeful that there will not be any future need for other interventions although they did tell him that there would likely be some mild chronic underlying renal damage.  Reviewed his postop labs which were normal

## 2024-01-22 ENCOUNTER — Other Ambulatory Visit: Payer: Self-pay

## 2024-01-22 ENCOUNTER — Encounter (HOSPITAL_BASED_OUTPATIENT_CLINIC_OR_DEPARTMENT_OTHER): Payer: Self-pay | Admitting: Plastic Surgery

## 2024-01-22 NOTE — H&P (Signed)
  Subjective Patient ID: Colleen Lopez is a 18 y.o. adult.     HPI     7.5 months post operative, last seen 9.2024. Notes bilateral dog ears. Desires to proceed with excision of these. Also notes left nipple more raised than right. Patient underwent surgery for left hydronephrosis, resolved. Patient would like to proceed as soon as possible as he plans to move to [facility] with significant other.   Patient has identified as female with preferred name Colleen Lopez since 2020 in all aspects of life.    On testosterone. On lupron for year, has not menstruated since that time.    No prior MMG. Reports breast cancer present on maternal side.   Wt down 20 lb since 08/2022 consult   Lives with GM. Not currently working or in school, hopes to complete GED.     Review of Systems   Objective Physical Exam  Cardiovascular: Normal rate, regular rhythm and normal heart sounds.    Pulmonary/Chest Effort normal and breath sounds normal.      Chest:  scars faded lateral and still some pink and increased width centrally, left FNG with nipple elevate and right FNG with nipple flap Small fullness lateral chest wall bilateral     Right medial extent IMF with faded fine line scar from cartilage/rib harvest for rhinoplasty   Sn to nipple (graft) R 17 L 17 cm Midline to nipple R 10 L 10 cm   Assessment/Plan Gender dysphoria s/p bilateral mastectomies FNG   Pictures today.     Plan revision bilateral chest with excision soft tissue redundancy. Offered in office under local vs in OR with sedation; patient has elected for latter. No drains anticipated. Reviewed with patient left nipple more elevated as likely more remnant breast tissue left on graft on this side. Can try to flatten by thinning graft. Patient desires to try this. Reviewed no guarantees that this will be symmetric with right, reviewed area I will make incisions. Reviewed possible bolster dressing, risk graft loss.    Glenna Fellows, MD  [facility]  Office/ physician access line after hours [phone]

## 2024-01-24 ENCOUNTER — Other Ambulatory Visit: Payer: MEDICAID

## 2024-01-24 DIAGNOSIS — Q6211 Congenital occlusion of ureteropelvic junction: Secondary | ICD-10-CM

## 2024-01-25 ENCOUNTER — Ambulatory Visit (HOSPITAL_COMMUNITY): Admission: RE | Admit: 2024-01-25 | Payer: MEDICAID | Source: Ambulatory Visit

## 2024-01-25 NOTE — Anesthesia Preprocedure Evaluation (Addendum)
 Anesthesia Evaluation  Patient identified by MRN, date of birth, ID band Patient awake    Reviewed: Allergy & Precautions, NPO status , Patient's Chart, lab work & pertinent test results  History of Anesthesia Complications (+) PONV and history of anesthetic complications  Airway Mallampati: II  TM Distance: >3 FB Neck ROM: Full    Dental  (+) Dental Advisory Given, Teeth Intact   Pulmonary asthma    Pulmonary exam normal breath sounds clear to auscultation       Cardiovascular negative cardio ROS Normal cardiovascular exam Rhythm:Regular Rate:Normal     Neuro/Psych  PSYCHIATRIC DISORDERS       Neuromuscular disease    GI/Hepatic Neg liver ROS,GERD  ,,  Endo/Other  negative endocrine ROS    Renal/GU Renal disease     Musculoskeletal negative musculoskeletal ROS (+)    Abdominal   Peds  Hematology negative hematology ROS (+)   Anesthesia Other Findings   Reproductive/Obstetrics                             Anesthesia Physical Anesthesia Plan  ASA: 2  Anesthesia Plan: General   Post-op Pain Management: Tylenol  PO (pre-op)* and Gabapentin  PO (pre-op)*   Induction: Intravenous  PONV Risk Score and Plan: 4 or greater and Ondansetron , Dexamethasone , Propofol  infusion, TIVA, Treatment may vary due to age or medical condition and Midazolam   Airway Management Planned: Oral ETT and LMA  Additional Equipment: None  Intra-op Plan:   Post-operative Plan: Extubation in OR  Informed Consent: I have reviewed the patients History and Physical, chart, labs and discussed the procedure including the risks, benefits and alternatives for the proposed anesthesia with the patient or authorized representative who has indicated his/her understanding and acceptance.     Dental advisory given  Plan Discussed with: CRNA  Anesthesia Plan Comments:        Anesthesia Quick Evaluation

## 2024-01-26 ENCOUNTER — Ambulatory Visit (HOSPITAL_BASED_OUTPATIENT_CLINIC_OR_DEPARTMENT_OTHER): Payer: MEDICAID | Admitting: Anesthesiology

## 2024-01-26 ENCOUNTER — Ambulatory Visit (HOSPITAL_BASED_OUTPATIENT_CLINIC_OR_DEPARTMENT_OTHER)
Admission: RE | Admit: 2024-01-26 | Discharge: 2024-01-26 | Disposition: A | Discharge status: Home / Self Care | Payer: MEDICAID | Attending: Plastic Surgery | Admitting: Plastic Surgery

## 2024-01-26 ENCOUNTER — Other Ambulatory Visit: Payer: Self-pay

## 2024-01-26 ENCOUNTER — Encounter (HOSPITAL_BASED_OUTPATIENT_CLINIC_OR_DEPARTMENT_OTHER): Payer: Self-pay | Admitting: Plastic Surgery

## 2024-01-26 ENCOUNTER — Encounter (HOSPITAL_BASED_OUTPATIENT_CLINIC_OR_DEPARTMENT_OTHER)
Admission: RE | Disposition: A | Discharge status: Home / Self Care | Payer: Self-pay | Source: Home / Self Care | Attending: Plastic Surgery

## 2024-01-26 DIAGNOSIS — L905 Scar conditions and fibrosis of skin: Secondary | ICD-10-CM | POA: Insufficient documentation

## 2024-01-26 DIAGNOSIS — L987 Excessive and redundant skin and subcutaneous tissue: Secondary | ICD-10-CM | POA: Diagnosis not present

## 2024-01-26 DIAGNOSIS — F640 Transsexualism: Secondary | ICD-10-CM | POA: Insufficient documentation

## 2024-01-26 DIAGNOSIS — F649 Gender identity disorder, unspecified: Secondary | ICD-10-CM | POA: Diagnosis not present

## 2024-01-26 DIAGNOSIS — Z9013 Acquired absence of bilateral breasts and nipples: Secondary | ICD-10-CM | POA: Insufficient documentation

## 2024-01-26 DIAGNOSIS — Z79890 Hormone replacement therapy (postmenopausal): Secondary | ICD-10-CM | POA: Insufficient documentation

## 2024-01-26 DIAGNOSIS — Z01818 Encounter for other preprocedural examination: Secondary | ICD-10-CM

## 2024-01-26 HISTORY — PX: LESION EXCISION WITH COMPLEX REPAIR: SHX6700

## 2024-01-26 LAB — DIHYDROTESTOSTERONE: Dihydrotestosterone: 25 ng/dL — ABNORMAL HIGH

## 2024-01-26 LAB — CBC
Hematocrit: 47.5 % — ABNORMAL HIGH (ref 34.0–46.6)
Hemoglobin: 16.5 g/dL — ABNORMAL HIGH (ref 11.1–15.9)
MCH: 30.7 pg (ref 26.6–33.0)
MCHC: 34.7 g/dL (ref 31.5–35.7)
MCV: 88 fL (ref 79–97)
Platelets: 254 10*3/uL (ref 150–450)
RBC: 5.38 x10E6/uL — ABNORMAL HIGH (ref 3.77–5.28)
RDW: 11.7 % (ref 11.7–15.4)
WBC: 8.5 10*3/uL (ref 3.4–10.8)

## 2024-01-26 LAB — COMPREHENSIVE METABOLIC PANEL
ALT: 14 [IU]/L (ref 0–32)
AST: 14 [IU]/L (ref 0–40)
Albumin: 4.7 g/dL (ref 4.0–5.0)
Alkaline Phosphatase: 80 [IU]/L (ref 42–106)
BUN/Creatinine Ratio: 7 — ABNORMAL LOW (ref 9–23)
BUN: 6 mg/dL (ref 6–20)
Bilirubin Total: 0.6 mg/dL (ref 0.0–1.2)
CO2: 20 mmol/L (ref 20–29)
Calcium: 9.8 mg/dL (ref 8.7–10.2)
Chloride: 105 mmol/L (ref 96–106)
Creatinine, Ser: 0.87 mg/dL (ref 0.57–1.00)
Globulin, Total: 2.5 g/dL (ref 1.5–4.5)
Glucose: 88 mg/dL (ref 70–99)
Potassium: 4.4 mmol/L (ref 3.5–5.2)
Sodium: 141 mmol/L (ref 134–144)
Total Protein: 7.2 g/dL (ref 6.0–8.5)
eGFR: 98 mL/min/{1.73_m2} (ref >59)

## 2024-01-26 LAB — TESTOSTERONE: Testosterone: 627 ng/dL — ABNORMAL HIGH (ref 13–71)

## 2024-01-26 LAB — POCT PREGNANCY, URINE: Preg Test, Ur: NEGATIVE

## 2024-01-26 SURGERY — LESION EXCISION WITH COMPLEX REPAIR
Anesthesia: General | Site: Chest | Laterality: Bilateral

## 2024-01-26 MED ORDER — OXYCODONE HCL 5 MG/5ML PO SOLN
5.0000 mg | Freq: Once | ORAL | Status: DC | PRN
Start: 2024-01-26 — End: 2024-01-26

## 2024-01-26 MED ORDER — ACETAMINOPHEN 500 MG PO TABS
ORAL_TABLET | ORAL | Status: AC
  Filled 2024-01-26: qty 2

## 2024-01-26 MED ORDER — DEXAMETHASONE SODIUM PHOSPHATE 4 MG/ML IJ SOLN: INTRAMUSCULAR | Status: DC | PRN

## 2024-01-26 MED ORDER — LIDOCAINE HCL (CARDIAC) PF 100 MG/5ML IV SOSY
PREFILLED_SYRINGE | INTRAVENOUS | Status: DC | PRN
  Administered 2024-01-26: 50 mg via INTRAVENOUS

## 2024-01-26 MED ORDER — FENTANYL CITRATE (PF) 100 MCG/2ML IJ SOLN
INTRAMUSCULAR | Status: AC
  Filled 2024-01-26: qty 2

## 2024-01-26 MED ORDER — CEFAZOLIN SODIUM-DEXTROSE 2-4 GM/100ML-% IV SOLN
INTRAVENOUS | Status: AC
  Filled 2024-01-26: qty 100

## 2024-01-26 MED ORDER — CHLORHEXIDINE GLUCONATE CLOTH 2 % EX PADS: 6.0000 | MEDICATED_PAD | Freq: Once | CUTANEOUS | Status: DC

## 2024-01-26 MED ORDER — ONDANSETRON HCL 4 MG/2ML IJ SOLN
INTRAMUSCULAR | Status: AC
  Filled 2024-01-26: qty 2

## 2024-01-26 MED ORDER — CELECOXIB 200 MG PO CAPS
ORAL_CAPSULE | ORAL | Status: AC
  Filled 2024-01-26: qty 1

## 2024-01-26 MED ORDER — BUPIVACAINE HCL (PF) 0.5 % IJ SOLN
INTRAMUSCULAR | Status: DC | PRN
  Administered 2024-01-26: 24 mL

## 2024-01-26 MED ORDER — LACTATED RINGERS IV SOLN: INTRAVENOUS | Status: DC

## 2024-01-26 MED ORDER — DEXMEDETOMIDINE HCL IN NACL 80 MCG/20ML IV SOLN
INTRAVENOUS | Status: DC | PRN
  Administered 2024-01-26: 12 ug via INTRAVENOUS

## 2024-01-26 MED ORDER — GABAPENTIN 300 MG PO CAPS: 300.0000 mg | ORAL_CAPSULE | ORAL | Status: AC

## 2024-01-26 MED ORDER — ACETAMINOPHEN 500 MG PO TABS: 1000.0000 mg | ORAL_TABLET | ORAL | Status: AC

## 2024-01-26 MED ORDER — GABAPENTIN 300 MG PO CAPS
300.0000 mg | ORAL_CAPSULE | Freq: Once | ORAL | Status: AC
  Administered 2024-01-26: 300 mg via ORAL

## 2024-01-26 MED ORDER — ONDANSETRON HCL 4 MG/2ML IJ SOLN
INTRAMUSCULAR | Status: DC | PRN
  Administered 2024-01-26: 4 mg via INTRAVENOUS

## 2024-01-26 MED ORDER — OXYCODONE HCL 5 MG PO TABS: 5.0000 mg | ORAL_TABLET | Freq: Once | ORAL | Status: DC | PRN

## 2024-01-26 MED ORDER — PROPOFOL 10 MG/ML IV BOLUS
INTRAVENOUS | Status: DC | PRN
  Administered 2024-01-26: 50 mg via INTRAVENOUS
  Administered 2024-01-26: 200 mg via INTRAVENOUS

## 2024-01-26 MED ORDER — MIDAZOLAM HCL 5 MG/5ML IJ SOLN
INTRAMUSCULAR | Status: DC | PRN
  Administered 2024-01-26: 2 mg via INTRAVENOUS

## 2024-01-26 MED ORDER — PHENYLEPHRINE 80 MCG/ML (10ML) SYRINGE FOR IV PUSH (FOR BLOOD PRESSURE SUPPORT)
PREFILLED_SYRINGE | INTRAVENOUS | Status: AC
Start: 2024-01-26 — End: ?
  Filled 2024-01-26: qty 10

## 2024-01-26 MED ORDER — BACITRACIN ZINC 500 UNIT/GM EX OINT
TOPICAL_OINTMENT | CUTANEOUS | Status: AC
  Filled 2024-01-26: qty 1.8

## 2024-01-26 MED ORDER — FENTANYL CITRATE (PF) 100 MCG/2ML IJ SOLN
25.0000 ug | INTRAMUSCULAR | Status: DC | PRN
Start: 2024-01-26 — End: 2024-01-26

## 2024-01-26 MED ORDER — ACETAMINOPHEN 500 MG PO TABS
1000.0000 mg | ORAL_TABLET | Freq: Once | ORAL | Status: AC
  Administered 2024-01-26: 1000 mg via ORAL

## 2024-01-26 MED ORDER — TRAMADOL HCL 50 MG PO TABS: 50.0000 mg | ORAL_TABLET | Freq: Four times a day (QID) | ORAL | 0 refills | Status: DC | PRN

## 2024-01-26 MED ORDER — BACITRACIN 500 UNIT/GM EX OINT
TOPICAL_OINTMENT | CUTANEOUS | Status: DC | PRN
  Administered 2024-01-26: 1 via TOPICAL

## 2024-01-26 MED ORDER — CELECOXIB 200 MG PO CAPS
200.0000 mg | ORAL_CAPSULE | ORAL | Status: AC
  Administered 2024-01-26: 200 mg via ORAL

## 2024-01-26 MED ORDER — GABAPENTIN 300 MG PO CAPS
ORAL_CAPSULE | ORAL | Status: AC
  Filled 2024-01-26: qty 1

## 2024-01-26 MED ORDER — PROPOFOL 500 MG/50ML IV EMUL
INTRAVENOUS | Status: DC | PRN
  Administered 2024-01-26: 200 ug/kg/min via INTRAVENOUS

## 2024-01-26 MED ORDER — SUCCINYLCHOLINE CHLORIDE 200 MG/10ML IV SOSY
PREFILLED_SYRINGE | INTRAVENOUS | Status: AC
Start: 2024-01-26 — End: ?
  Filled 2024-01-26: qty 10

## 2024-01-26 MED ORDER — BUPIVACAINE HCL (PF) 0.5 % IJ SOLN
INTRAMUSCULAR | Status: AC
  Filled 2024-01-26: qty 30

## 2024-01-26 MED ORDER — FENTANYL CITRATE (PF) 100 MCG/2ML IJ SOLN
INTRAMUSCULAR | Status: DC | PRN
  Administered 2024-01-26: 100 ug via INTRAVENOUS

## 2024-01-26 MED ORDER — CEFAZOLIN SODIUM-DEXTROSE 2-4 GM/100ML-% IV SOLN
2.0000 g | INTRAVENOUS | Status: AC
  Administered 2024-01-26: 2 g via INTRAVENOUS

## 2024-01-26 MED ORDER — LIDOCAINE 2% (20 MG/ML) 5 ML SYRINGE
INTRAMUSCULAR | Status: AC
Start: 2024-01-26 — End: ?
  Filled 2024-01-26: qty 5

## 2024-01-26 MED ORDER — EPHEDRINE 5 MG/ML INJ
INTRAVENOUS | Status: AC
Start: 2024-01-26 — End: ?
  Filled 2024-01-26: qty 5

## 2024-01-26 MED ORDER — MIDAZOLAM HCL 2 MG/2ML IJ SOLN
INTRAMUSCULAR | Status: AC
  Filled 2024-01-26: qty 2

## 2024-01-26 MED ORDER — DEXAMETHASONE SODIUM PHOSPHATE 10 MG/ML IJ SOLN
INTRAMUSCULAR | Status: AC
Start: 2024-01-26 — End: ?
  Filled 2024-01-26: qty 1

## 2024-01-26 MED ORDER — DROPERIDOL 2.5 MG/ML IJ SOLN
INTRAMUSCULAR | Status: AC
  Filled 2024-01-26: qty 2

## 2024-01-26 MED ORDER — DEXAMETHASONE SODIUM PHOSPHATE 4 MG/ML IJ SOLN
INTRAMUSCULAR | Status: DC | PRN
  Administered 2024-01-26: 5 mg via INTRAVENOUS

## 2024-01-26 MED ORDER — DROPERIDOL 2.5 MG/ML IJ SOLN
0.6250 mg | Freq: Once | INTRAMUSCULAR | Status: AC | PRN
  Administered 2024-01-26: 0.625 mg via INTRAVENOUS

## 2024-01-26 SURGICAL SUPPLY — 64 items
BAG DECANTER FOR FLEXI CONT (MISCELLANEOUS) ×1 IMPLANT
BINDER BREAST LRG (GAUZE/BANDAGES/DRESSINGS) IMPLANT
BINDER BREAST MEDIUM (GAUZE/BANDAGES/DRESSINGS) IMPLANT
BINDER BREAST XLRG (GAUZE/BANDAGES/DRESSINGS) IMPLANT
BINDER BREAST XXLRG (GAUZE/BANDAGES/DRESSINGS) IMPLANT
BLADE SURG 10 STRL SS (BLADE) ×4 IMPLANT
BLADE SURG 15 STRL LF DISP TIS (BLADE) IMPLANT
BNDG ELASTIC 6INX 5YD STR LF (GAUZE/BANDAGES/DRESSINGS) IMPLANT
BNDG GAUZE DERMACEA FLUFF 4 (GAUZE/BANDAGES/DRESSINGS) ×2 IMPLANT
CANISTER SUCT 1200ML W/VALVE (MISCELLANEOUS) ×1 IMPLANT
CHLORAPREP W/TINT 26 (MISCELLANEOUS) ×1 IMPLANT
COVER BACK TABLE 60X90IN (DRAPES) ×1 IMPLANT
COVER MAYO STAND STRL (DRAPES) ×1 IMPLANT
DERMABOND ADVANCED .7 DNX12 (GAUZE/BANDAGES/DRESSINGS) ×1 IMPLANT
DRAIN CHANNEL 15F RND FF W/TCR (WOUND CARE) ×1 IMPLANT
DRAPE TOP ARMCOVERS (MISCELLANEOUS) ×1 IMPLANT
DRAPE U-SHAPE 76X120 STRL (DRAPES) ×1 IMPLANT
DRAPE UTILITY XL STRL (DRAPES) ×1 IMPLANT
ELECT BLADE 4.0 EZ CLEAN MEGAD (MISCELLANEOUS)
ELECT COATED BLADE 2.86 ST (ELECTRODE) ×1 IMPLANT
ELECT REM PT RETURN 9FT ADLT (ELECTROSURGICAL) ×1
ELECTRODE BLDE 4.0 EZ CLN MEGD (MISCELLANEOUS) ×1 IMPLANT
ELECTRODE REM PT RTRN 9FT ADLT (ELECTROSURGICAL) ×1 IMPLANT
EVACUATOR SILICONE 100CC (DRAIN) ×1 IMPLANT
GAUZE PAD ABD 8X10 STRL (GAUZE/BANDAGES/DRESSINGS) ×2 IMPLANT
GAUZE SPONGE 4X4 12PLY STRL (GAUZE/BANDAGES/DRESSINGS) IMPLANT
GLOVE BIO SURGEON STRL SZ 6 (GLOVE) ×1 IMPLANT
GLOVE BIO SURGEON STRL SZ 6.5 (GLOVE) IMPLANT
GLOVE BIOGEL PI IND STRL 6.5 (GLOVE) IMPLANT
GLOVE BIOGEL PI IND STRL 7.0 (GLOVE) IMPLANT
GLOVE ECLIPSE 6.5 STRL STRAW (GLOVE) IMPLANT
GOWN STRL REUS W/ TWL LRG LVL3 (GOWN DISPOSABLE) ×2 IMPLANT
IV NS 500ML BAXH (IV SOLUTION) ×1 IMPLANT
KIT FILL ASEPTIC TRANSFER (MISCELLANEOUS) IMPLANT
MARKER SKIN DUAL TIP RULER LAB (MISCELLANEOUS) IMPLANT
NDL HYPO 25X1 1.5 SAFETY (NEEDLE) IMPLANT
NEEDLE HYPO 25X1 1.5 SAFETY (NEEDLE) ×1
PACK BASIN DAY SURGERY FS (CUSTOM PROCEDURE TRAY) ×1 IMPLANT
PENCIL SMOKE EVACUATOR (MISCELLANEOUS) ×1 IMPLANT
PIN SAFETY STERILE (MISCELLANEOUS) ×1 IMPLANT
PUNCH BIOPSY 4MM DISP (MISCELLANEOUS) IMPLANT
SHEET MEDIUM DRAPE 40X70 STRL (DRAPES) ×1 IMPLANT
SLEEVE SCD COMPRESS KNEE MED (STOCKING) ×1 IMPLANT
SPIKE FLUID TRANSFER (MISCELLANEOUS) IMPLANT
SPONGE T-LAP 18X18 ~~LOC~~+RFID (SPONGE) ×2 IMPLANT
STAPLER SKIN PROX WIDE 3.9 (STAPLE) ×1 IMPLANT
SUT CHROMIC 5 0 RB 1 27 (SUTURE) IMPLANT
SUT ETHILON 2 0 FS 18 (SUTURE) ×1 IMPLANT
SUT MNCRL AB 4-0 PS2 18 (SUTURE) ×1 IMPLANT
SUT PDS 3-0 CT2 (SUTURE)
SUT PDS AB 2-0 CT2 27 (SUTURE) IMPLANT
SUT PDS II 3-0 CT2 27 ABS (SUTURE) IMPLANT
SUT PROLENE 2 0 CT2 30 (SUTURE) IMPLANT
SUT VIC AB 3-0 PS1 18XBRD (SUTURE) IMPLANT
SUT VIC AB 3-0 SH 27X BRD (SUTURE) IMPLANT
SUT VIC AB 4-0 PS2 18 (SUTURE) ×1 IMPLANT
SYR 10ML LL (SYRINGE) IMPLANT
SYR 50ML LL SCALE MARK (SYRINGE) IMPLANT
SYR BULB IRRIG 60ML STRL (SYRINGE) ×1 IMPLANT
SYR CONTROL 10ML LL (SYRINGE) IMPLANT
TOWEL GREEN STERILE FF (TOWEL DISPOSABLE) ×2 IMPLANT
TUBE CONNECTING 20X1/4 (TUBING) ×1 IMPLANT
UNDERPAD 30X36 HEAVY ABSORB (UNDERPADS AND DIAPERS) ×2 IMPLANT
YANKAUER SUCT BULB TIP NO VENT (SUCTIONS) ×1 IMPLANT

## 2024-01-26 NOTE — Anesthesia Procedure Notes (Signed)
 Procedure Name: LMA Insertion Date/Time: 01/26/2024 10:25 AM  Performed by: Emilio Rock BIRCH, CRNAPre-anesthesia Checklist: Patient identified, Emergency Drugs available, Suction available and Patient being monitored Patient Re-evaluated:Patient Re-evaluated prior to induction Oxygen Delivery Method: Circle System Utilized Preoxygenation: Pre-oxygenation with 100% oxygen Induction Type: IV induction Ventilation: Mask ventilation without difficulty LMA: LMA inserted LMA Size: 4.0 Number of attempts: 1 Airway Equipment and Method: bite block Placement Confirmation: positive ETCO2 Tube secured with: Tape Dental Injury: Teeth and Oropharynx as per pre-operative assessment

## 2024-01-26 NOTE — Transfer of Care (Signed)
 Immediate Anesthesia Transfer of Care Note  Patient: Colleen Lopez  Procedure(s) Performed: SCAR REVISION, COMPLEX REPAIR CHEST, LEFT NIPPLE AREOLA 10 CM (Bilateral: Chest)  Patient Location: PACU  Anesthesia Type:General  Level of Consciousness: sedated  Airway & Oxygen Therapy: Patient Spontanous Breathing and Patient connected to face mask oxygen  Post-op Assessment: Report given to RN and Post -op Vital signs reviewed and stable  Post vital signs: Reviewed and stable  Last Vitals:  Vitals Value Taken Time  BP    Temp    Pulse    Resp    SpO2      Last Pain:  Vitals:   01/26/24 0835  TempSrc: Temporal  PainSc: 0-No pain      Patients Stated Pain Goal: 3 (01/26/24 0835)  Complications: No notable events documented.

## 2024-01-26 NOTE — Interval H&P Note (Signed)
 History and Physical Interval Note:  01/26/2024 9:08 AM  Colleen Lopez  has presented today for surgery, with the diagnosis of Scar.  The various methods of treatment have been discussed with the patient and family. After consideration of risks, benefits and other options for treatment, the patient has consented to  Procedure(s): COMPLEX REPAIR CHEST, LEFT NIPPLE AREOLA 10 CM (Bilateral) as a surgical intervention.  The patient's history has been reviewed, patient examined, no change in status, stable for surgery.  I have reviewed the patient's chart and labs.  Questions were answered to the patient's satisfaction.     Earlis [name]

## 2024-01-26 NOTE — Discharge Instructions (Addendum)
  Post Anesthesia Home Care Instructions  Activity: Get plenty of rest for the remainder of the day. A responsible individual must stay with you for 24 hours following the procedure.  For the next 24 hours, DO NOT: -Drive a car -Advertising copywriter -Drink alcoholic beverages -Take any medication unless instructed by your physician -Make any legal decisions or sign important papers.  Meals: Start with liquid foods such as gelatin or soup. Progress to regular foods as tolerated. Avoid greasy, spicy, heavy foods. If nausea and/or vomiting occur, drink only clear liquids until the nausea and/or vomiting subsides. Call your physician if vomiting continues.  Special Instructions/Symptoms: Your throat may feel dry or sore from the anesthesia or the breathing tube placed in your throat during surgery. If this causes discomfort, gargle with warm salt water . The discomfort should disappear within 24 hours.  If you had a scopolamine patch placed behind your ear for the management of post- operative nausea and/or vomiting:  1. The medication in the patch is effective for 72 hours, after which it should be removed.  Wrap patch in a tissue and discard in the trash. Wash hands thoroughly with soap and water . 2. You may remove the patch earlier than 72 hours if you experience unpleasant side effects which may include dry mouth, dizziness or visual disturbances. 3. Avoid touching the patch. Wash your hands with soap and water  after contact with the patch.    Next dose of Tylenol  may be given at 2:40pm if needed. Next dose of NSAIDs (Ibuprofen /Motrin Jeronimo) may be given at 4:40pm if needed.

## 2024-01-26 NOTE — Op Note (Signed)
 Operative Note   DATE OF OPERATION: 2.7.2025  LOCATION: [facility]-outpatient  SURGICAL DIVISION: Plastic Surgery  PREOPERATIVE DIAGNOSES:  1. Scar 2. Gender dysphoria  POSTOPERATIVE DIAGNOSES:  same  PROCEDURE:  Complex repair bilateral chest 10 cm  SURGEON: Earlis Ranks MD MBA  ASSISTANT: none  ANESTHESIA:  General.   EBL: minimal  COMPLICATIONS: None immediate.   INDICATIONS FOR PROCEDURE:  The patient, Colleen Lopez, is a 18 y.o. adult born on 03-03-2006, is here for revision soft tissue bilateral lateral chest wall and left nipple graft following gender affirming chest surgery.   FINDINGS: Right lateral chest length repair 4 cm, left lateral chest length repair 5 cm, length incision left areola 1 cm  DESCRIPTION OF PROCEDURE:  The patient's operative site was marked with the patient in the preoperative area in standing position. The patient was taken to the operating room. SCDs were placed and IV antibiotics were given. The patient's operative site was prepped and draped in a sterile fashion. A time out was performed and all information was confirmed to be correct. Local anesthetic infiltrated over all surgical areas. Over left lateral chest, redundant soft tissue marked for excision sharply excised. Hemostasis ensured. Closure completed in layers with 3-0 vicryl in dermis and 4-0 monocryl subcuticular skin closure length 5 cm. Incision made from 3 to 6 to 9 o clock of free nipple graft left chest. Graft elevated and underside graft thinned with scissors. Graft re inset with 5-o chromic sutures. Quilting sutures also placed through graft/areola with 5-0 chromic. Over right chest, redundant soft tissue marked for excision sharply excised. Hemostasis ensured. Closure completed in layers with 3-0 vicryl in dermis and 4-0 monocryl subcuticular skin closure length 4 cm.  The patient was allowed to wake from anesthesia, extubated and taken to the recovery room in  satisfactory condition.   SPECIMENS: none  DRAINS: none  Earlis Ranks, MD [facility]  Office/ physician access line after hours [phone]

## 2024-01-27 NOTE — Anesthesia Postprocedure Evaluation (Signed)
 Anesthesia Post Note  Patient: Britanee Vanblarcom  Procedure(s) Performed: SCAR REVISION WITH COMPLEX REPAIR BILATERAL CHEST AND LEFT NIPPLE AREOLA (Bilateral: Chest)     Patient location during evaluation: PACU Anesthesia Type: General Level of consciousness: sedated and patient cooperative Pain management: pain level controlled Vital Signs Assessment: post-procedure vital signs reviewed and stable Respiratory status: spontaneous breathing Cardiovascular status: stable Anesthetic complications: no   No notable events documented.  Last Vitals:  Vitals:   01/26/24 1200 01/26/24 1254  BP: (!) 96/56 112/69  Pulse: 63 79  Resp: 13 16  Temp:  36.5 C  SpO2: 98% 97%    Last Pain:  Vitals:   01/26/24 1254  TempSrc: Temporal  PainSc: 0-No pain                 Norleen Pope

## 2024-01-28 ENCOUNTER — Encounter (HOSPITAL_BASED_OUTPATIENT_CLINIC_OR_DEPARTMENT_OTHER): Payer: Self-pay | Admitting: Plastic Surgery

## 2024-01-30 NOTE — Progress Notes (Deleted)
 Name: Colleen Lopez DOB: [birth year] MRN: [id number]  History of Present Illness: Colleen Lopez is a 18 y.o. adult who presents today for follow up visit at [facility].  ***He is accompanied by ***. - GU history: 1. 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  2. 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 Present on imaging dating back to at least 04/09/2013 (age 38).  3. LUTS (dysuria, frequency, urgency).  4. High-tone pelvic floor dysfunction.  Recent history:  > 10/05/2023: Left percutaneous nephrostomy tube place by IR.   > 10/06/2023: Lasix renogram showed high-grade obstruction of the left kidney (differential: left kidney = 26%; right kidney = 74%).   > 10/20/2023: Urine culture positive for Enterococcus faecalis.   > 11/20/2023:  - Underwent left robot assisted laparoscopic pyeloplasty and left ureteral stent placement by Dr. Ronne Binning for management of ***   > 11/29/2023:  - Postop visit with Dr. Ronne Binning.  - Patient having intermittent bladder spasms and occasional dysuria. - Gemtesa 75 mg daily & Pyridium prescribed.   > 12/02/2023: Seen in ER for dysuria, subjective fever, and nausea x3 days.  - CT showed no acute findings. - CMP with normal renal function (GFR >60; creatinine 1.04). - CBC with no leukocytosis (WBC 7.7). - UA showed 10 WBC/hpf, 50 RBC/hpf, bacteria (occasional). Treated with IV Rocephin 1 gram for UTI. - Urine culture positive for 50-100k Enterococcus faecalis and 10-50k Streptococcus mitis group. - Cefdinir initially prescribed for empiric treatment; later switched to a different antibiotic based on culture sensitivities (unclear which medication).  At last visit with Dr. Ronne Binning on 12/27/2023: Left ureteral stent removed via office cystoscopy. Advised follow up in 6 weeks with RUS.  Since last visit: > 01/11/2024: Lasix renogram showed normal right kidney function and impaired left kidney function. Differential:  Left kidney = 20%, Right kidney = 80%.  > 01/17/2024: Renal function normal (creatinine 0.87, GFR >90).  > 2/***/2025: RUS showed ***  Today: He reports ***  He {Actions; denies-reports:120008} increased urinary urgency, frequency, nocturia, dysuria, gross hematuria, hesitancy, straining to void, or sensations of incomplete emptying.  He {Actions; denies-reports:120008} flank pain or abdominal pain. He {Actions; denies-reports:120008} fevers, nausea, or vomiting.   Fall Screening: Do you usually have a device to assist in your mobility? {yes/no:20286} ***cane / ***walker / ***wheelchair   Medications: Current Outpatient Medications  Medication Sig Dispense Refill   minoxidil (LONITEN) 2.5 MG tablet Take one tab by mouth daily 90 tablet 3   testosterone cypionate (DEPOTESTOSTERONE CYPIONATE) 200 MG/ML injection Inject 0.25 mLs (50 mg total) into the skin every 7 (seven) days. 4 mL 5   traMADol (ULTRAM) 50 MG tablet Take 1 tablet (50 mg total) by mouth every 6 (six) hours as needed. 5 tablet 0   Vitamin D, Cholecalciferol, 10 MCG (400 UNIT) TABS Take by mouth.     No current facility-administered medications for this visit.    Allergies: No Known Allergies  Past Medical History:  Diagnosis Date   Abdominal pain    ADHD (attention deficit hyperactivity disorder)    Asthma    Dysautonomia (HCC)    Pervasive developmental disorder    PONV (postoperative nausea and vomiting)    POTS (postural orthostatic tachycardia syndrome)    Vision abnormalities    Vomiting    Past Surgical History:  Procedure Laterality Date   AREOLA/NIPPLE RECONSTRUCTION WITH GRAFT Bilateral 06/09/2023   Procedure: AREOLA/NIPPLE RECONSTRUCTION WITH GRAFT;  Surgeon: Glenna Fellows, MD;  Location: [facility];  Service: Government social research officer;  Laterality: Bilateral;   IR NEPHROSTOMY PLACEMENT LEFT  10/05/2023   LESION EXCISION WITH COMPLEX REPAIR Bilateral 01/26/2024   Procedure: SCAR REVISION WITH  COMPLEX REPAIR BILATERAL CHEST AND LEFT NIPPLE AREOLA;  Surgeon: Glenna Fellows, MD;  Location: [facility];  Service: Plastics;  Laterality: Bilateral;   ROBOT ASSISTED PYELOPLASTY Left 11/20/2023   Procedure: XI ROBOTIC ASSISTED PYELOPLASTY with left ureteral stent placement;  Surgeon: Malen Gauze, MD;  Location: [facility];  Service: Urology;  Laterality: Left;   septorhinoplasty  07/2020   SIMPLE MASTECTOMY WITH AXILLARY SENTINEL NODE BIOPSY Bilateral 06/09/2023   Procedure: SIMPLE MASTECTOMY;  Surgeon: Glenna Fellows, MD;  Location: [facility];  Service: Plastics;  Laterality: Bilateral;   Family History  Problem Relation Age of Onset   Obesity Maternal Grandmother    Diabetes type II Maternal Grandmother    Hypertension Maternal Grandmother    COPD Maternal Grandmother    Hyperlipidemia Maternal Grandmother    Heart disease Maternal Grandmother    Aneurysm Maternal Grandfather    Early death Maternal Grandfather    Bipolar disorder Mother    Drug abuse Mother    Drug abuse Father    Alcohol abuse Father    ADD / ADHD Brother    Bipolar disorder Paternal Uncle    Migraines Neg Hx    Social History   Socioeconomic History   Marital status: Significant Other    Spouse name: Not on file   Number of children: Not on file   Years of education: Not on file   Highest education level: Not on file  Occupational History   Not on file  Tobacco Use   Smoking status: Never    Passive exposure: Yes   Smokeless tobacco: Never  Vaping Use   Vaping status: Never Used  Substance and Sexual Activity   Alcohol use: No   Drug use: Yes    Types: Marijuana    Comment: 3 days ago   Sexual activity: Never  Other Topics Concern   Not on file  Social History Narrative   ** Merged History Encounter **       Lives at home with grandmother and step grandfather, aunt and two half brothers. MGM said she was three weeks early and was detox from heroine and  meth, morphine was used for detox.      Lives with Colleen Lopez and 2 half brothers.    He is going to work towards getting GED   He enjoys playing bass, (Psychiatric nurse - guitar)  drawing, and fantasy world building.       Getting his GED. 24-25 school year   Social Drivers of Corporate investment banker Strain: Not on file  Food Insecurity: No Food Insecurity (11/20/2023)   Hunger Vital Sign    Worried About Running Out of Food in the Last Year: Never true    Ran Out of Food in the Last Year: Never true  Transportation Needs: No Transportation Needs (11/20/2023)   PRAPARE - Administrator, Civil Service (Medical): No    Lack of Transportation (Non-Medical): No  Physical Activity: Not on file  Stress: Not on file  Social Connections: Not on file  Intimate Partner Violence: Not At Risk (11/20/2023)   Humiliation, Afraid, Rape, and Kick questionnaire    Fear of Current or Ex-Partner: No    Emotionally Abused: No    Physically Abused: No    Sexually Abused:  No    Review of Systems Constitutional: Patient denies any unintentional weight loss or change in strength lntegumentary: Patient denies any rashes or pruritus Cardiovascular: Patient denies chest pain or syncope Respiratory: Patient denies shortness of breath Gastrointestinal: Patient ***denies nausea, vomiting, constipation, or diarrhea Musculoskeletal: Patient denies muscle cramps or weakness Neurologic: Patient denies convulsions or seizures Allergic/Immunologic: Patient denies recent allergic reaction(s) Hematologic/Lymphatic: Patient denies bleeding tendencies Endocrine: Patient denies heat/cold intolerance  GU: As per HPI.  OBJECTIVE There were no vitals filed for this visit. There is no height or weight on file to calculate BMI.  Physical Examination Constitutional: No obvious distress; patient is non-toxic appearing  Cardiovascular: No visible lower extremity edema.  Respiratory: The patient does not  have audible wheezing/stridor; respirations do not appear labored  Gastrointestinal: Abdomen non-distended Musculoskeletal: Normal ROM of UEs  Skin: No obvious rashes/open sores  Neurologic: CN 2-12 grossly intact Psychiatric: Answered questions appropriately with normal affect  Hematologic/Lymphatic/Immunologic: No obvious bruises or sites of spontaneous bleeding  UA:  ***positive for *** leukocytes, *** blood, ***nitrites ***Urine microscopy:  ***negative  *** WBC/hpf, *** RBC/hpf, *** bacteria ***with no evidence of UTI ***with no evidence of microscopic hematuria ***otherwise unremarkable ***glucosuria (secondary to ***Jardiance ***Farxiga use)  PVR: *** ml  ASSESSMENT No diagnosis found. ***  We agreed to plan for follow up in *** months / ***1 year or sooner if needed. Patient verbalized understanding of and agreement with current plan. All questions were answered.  PLAN Advised the following: 1. *** 2. ***No follow-ups on file.  No orders of the defined types were placed in this encounter.   It has been explained that the patient is to follow regularly with their PCP in addition to all other providers involved in their care and to follow instructions provided by these respective offices. Patient advised to contact urology clinic if any urologic-pertaining questions, concerns, new symptoms or problems arise in the interim period.  There are no Patient Instructions on file for this visit.  Electronically signed by:  Donnita Falls, FNP   01/30/24    4:21 PM

## 2024-02-01 ENCOUNTER — Ambulatory Visit: Payer: MEDICAID | Admitting: Urology

## 2024-02-01 DIAGNOSIS — R399 Unspecified symptoms and signs involving the genitourinary system: Secondary | ICD-10-CM

## 2024-02-01 DIAGNOSIS — Q6239 Other obstructive defects of renal pelvis and ureter: Secondary | ICD-10-CM

## 2024-02-01 DIAGNOSIS — N1330 Unspecified hydronephrosis: Secondary | ICD-10-CM

## 2024-02-01 DIAGNOSIS — M6289 Other specified disorders of muscle: Secondary | ICD-10-CM

## 2024-02-05 ENCOUNTER — Ambulatory Visit (HOSPITAL_COMMUNITY)
Admission: RE | Admit: 2024-02-05 | Discharge: 2024-02-05 | Disposition: A | Discharge status: Home / Self Care | Payer: MEDICAID | Source: Ambulatory Visit | Attending: Urology | Admitting: Urology

## 2024-02-05 DIAGNOSIS — Q6211 Congenital occlusion of ureteropelvic junction: Secondary | ICD-10-CM | POA: Diagnosis present

## 2024-02-26 ENCOUNTER — Other Ambulatory Visit: Payer: Self-pay | Admitting: *Deleted

## 2024-02-26 ENCOUNTER — Telehealth: Payer: Self-pay

## 2024-02-26 ENCOUNTER — Telehealth: Payer: MEDICAID | Admitting: Family Medicine

## 2024-02-26 NOTE — Telephone Encounter (Signed)
 Patient needs a refill sent in her Depo.

## 2024-02-27 MED ORDER — MEDROXYPROGESTERONE ACETATE 150 MG/ML IM SUSP: 150.0000 mg | INTRAMUSCULAR | 4 refills | Status: DC

## 2024-03-03 ENCOUNTER — Other Ambulatory Visit: Payer: Self-pay | Admitting: Obstetrics & Gynecology

## 2024-03-03 MED ORDER — MEDROXYPROGESTERONE ACETATE 150 MG/ML IM SUSP: 150.0000 mg | INTRAMUSCULAR | 4 refills | Status: AC

## 2024-03-03 NOTE — Progress Notes (Signed)
 Refill for Depot  Myna Hidalgo, DO Attending Obstetrician & Gynecologist, [facility] for [facility], [facility]

## 2024-03-12 ENCOUNTER — Ambulatory Visit: Payer: MEDICAID | Admitting: Obstetrics & Gynecology

## 2024-03-12 ENCOUNTER — Encounter: Payer: Self-pay | Admitting: Obstetrics & Gynecology

## 2024-03-12 VITALS — BP 139/83 | HR 106 | Ht 65.0 in | Wt 148.6 lb

## 2024-03-12 DIAGNOSIS — R102 Pelvic and perineal pain: Secondary | ICD-10-CM

## 2024-03-12 DIAGNOSIS — N9411 Superficial (introital) dyspareunia: Secondary | ICD-10-CM

## 2024-03-12 DIAGNOSIS — N939 Abnormal uterine and vaginal bleeding, unspecified: Secondary | ICD-10-CM

## 2024-03-12 DIAGNOSIS — Z789 Other specified health status: Secondary | ICD-10-CM | POA: Diagnosis not present

## 2024-03-12 DIAGNOSIS — N942 Vaginismus: Secondary | ICD-10-CM

## 2024-03-12 NOTE — Progress Notes (Signed)
 GYN VISIT Patient name: Colleen Lopez MRN [id number]  Date of birth: 04-17-2006 Chief Complaint:   Contraception (Discuss options)  History of Present Illness:   Colleen Lopez is a 18 y.o. female to female transgender patient who presents for the following concerns  1.  AUB: Currently on Depo for contraception and to prevent menses.  Still having irregular bleeding.  Seems to work for a bout a month- then will having spotting and having light spotting for a week then taper.  Last Depo was only 18 days ago and still having irregular spotting  2. Pelvic pain: Seems to have gotten progressively worse over time.  Notes a deep internal pain mostly with menses.  Also notes pelvic discomfort and unable to have sexual penetration.  He has tried to use dilators with minimal improvement.  He is concerned about endometriosis as there are other family members with this diagnosis.  Recently under the care of of nephrology due to left UPJ obstruction  No LMP recorded. Patient has had an injection.    Review of Systems:   Pertinent items are noted in HPI Denies fever/chills, dizziness, headaches, visual disturbances, fatigue, shortness of breath, chest pain, abdominal pain, vomiting. Pertinent History Reviewed:   Past Surgical History:  Procedure Laterality Date   AREOLA/NIPPLE RECONSTRUCTION WITH GRAFT Bilateral 06/09/2023   Procedure: AREOLA/NIPPLE RECONSTRUCTION WITH GRAFT;  Surgeon: Glenna Fellows, MD;  Location: [facility];  Service: Plastics;  Laterality: Bilateral;   IR NEPHROSTOMY PLACEMENT LEFT  10/05/2023   LESION EXCISION WITH COMPLEX REPAIR Bilateral 01/26/2024   Procedure: SCAR REVISION WITH COMPLEX REPAIR BILATERAL CHEST AND LEFT NIPPLE AREOLA;  Surgeon: Glenna Fellows, MD;  Location: [facility];  Service: Plastics;  Laterality: Bilateral;   ROBOT ASSISTED PYELOPLASTY Left 11/20/2023   Procedure: XI ROBOTIC ASSISTED PYELOPLASTY with left ureteral stent  placement;  Surgeon: Malen Gauze, MD;  Location: [facility];  Service: Urology;  Laterality: Left;   septorhinoplasty  07/2020   SIMPLE MASTECTOMY WITH AXILLARY SENTINEL NODE BIOPSY Bilateral 06/09/2023   Procedure: SIMPLE MASTECTOMY;  Surgeon: Glenna Fellows, MD;  Location: [facility];  Service: Plastics;  Laterality: Bilateral;    Past Medical History:  Diagnosis Date   Abdominal pain    ADHD (attention deficit hyperactivity disorder)    Asthma    Dysautonomia (HCC)    Pervasive developmental disorder    PONV (postoperative nausea and vomiting)    POTS (postural orthostatic tachycardia syndrome)    Vision abnormalities    Vomiting    Reviewed problem list, medications and allergies. Physical Assessment:   Vitals:   03/12/24 1509  BP: 139/83  Pulse: (!) 106  Weight: 148 lb 9.6 oz (67.4 kg)  Height: 5\' 5"  (1.651 m)  Body mass index is 24.73 kg/m.       Physical Examination:   General appearance: alert, well appearing, and in no distress  Psych: mood appropriate, normal affect  Skin: warm & dry   Cardiovascular: normal heart rate noted  Respiratory: normal respiratory effort, no distress  Abdomen: soft, non-tender, no rebound, no guarding  Pelvic: VULVA: normal appearing vulva with no masses, tenderness or lesions, anticipated clitormegaly.  Pt noted moderate discomfort with palpation at introitus- left >right.  With insertion of digit- pt noted 4/5 pain.  Cervix palpated and appeared normal.  No discrete lesions or defects noted.    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 -discussed conservative measure which Colleen Lopez is well aware of including pelvic floor therapy []  plan to look into resources closer to [facility] -plan for ultrsaound to r/o underlying etiology -addressed concerns regarding diagnosis of endometriosis and reviewed gold standard and for  now plan to address symptoms -May consider vaginal estrogen; however, recommended that this be used in coordination with pelvic floor therapy  2) AUB -reviewed options including transition to Nexplanon or IUD -based on today's exam, suspect Nexplanon will be best option -also discussed moderate to high dose progesterone such as Aygestin -pt plans to review on their own and will let us know  3) Female to female transgender -under care of endocrinology -pt aware that some of these symptoms may be "hormonally" related and unfortunately we don't always have best answers as to the cause  Return in about 7 weeks (around 04/30/2024) for abd/pelvic US next available.   Myna Hidalgo, DO Attending Obstetrician & Gynecologist, [facility] for [employer], [facility]

## 2024-03-15 ENCOUNTER — Telehealth (INDEPENDENT_AMBULATORY_CARE_PROVIDER_SITE_OTHER): Payer: MEDICAID | Admitting: Family Medicine

## 2024-03-15 DIAGNOSIS — L700 Acne vulgaris: Secondary | ICD-10-CM | POA: Diagnosis not present

## 2024-03-15 DIAGNOSIS — F419 Anxiety disorder, unspecified: Secondary | ICD-10-CM | POA: Diagnosis not present

## 2024-03-15 DIAGNOSIS — F909 Attention-deficit hyperactivity disorder, unspecified type: Secondary | ICD-10-CM

## 2024-03-15 NOTE — Progress Notes (Signed)
 Virtual Visit via Video Note  I connected with Colleen Lopez on 03/15/24 at  8:40 AM EDT by a video enabled telemedicine application and verified that I am speaking with the correct person using two identifiers.  Patient Location: Home Provider Location: Office/Clinic  I discussed the limitations, risks, security, and privacy concerns of performing an evaluation and management service by video and the availability of in person appointments. I also discussed with the patient that there may be a patient responsible charge related to this service. The patient expressed understanding and agreed to proceed.  Subjective: PCP: Colleen Ramp, MD  No chief complaint on file.  HPI  Colleen Lopez is a transgender female (female-to-female) who presents today requesting referrals for dermatology and psychiatry. He is currently involved in the court system and is seeking care in closer proximity to his location. He reports being followed previously by dermatology in [employer] and is currently taking Accutane. He would like to establish care with a local dermatologist to continue management and monitoring of his treatment. He also requests a referral to psychiatry for evaluation and management of anxiety, ADHD, and possible autism spectrum disorder. He was previously seeing Dr. Diannia Ruder but expresses a preference to follow up with a different provider.  ROS: Per HPI  Current Outpatient Medications:    ISOtretinoin (ACCUTANE) 40 MG capsule, Take 40 mg by mouth 2 (two) times daily., Disp: , Rfl:    medroxyPROGESTERone (DEPO-PROVERA) 150 MG/ML injection, Inject 1 mL (150 mg total) into the muscle every 3 (three) months., Disp: 1 mL, Rfl: 4   minoxidil (LONITEN) 2.5 MG tablet, Take one tab by mouth daily, Disp: 90 tablet, Rfl: 3   testosterone cypionate (DEPOTESTOSTERONE CYPIONATE) 200 MG/ML injection, Inject 0.25 mLs (50 mg total) into the skin every 7 (seven) days., Disp: 4 mL, Rfl: 5   traMADol (ULTRAM)  50 MG tablet, Take 1 tablet (50 mg total) by mouth every 6 (six) hours as needed. (Patient not taking: Reported on 03/12/2024), Disp: 5 tablet, Rfl: 0   Vitamin D, Cholecalciferol, 10 MCG (400 UNIT) TABS, Take by mouth. (Patient not taking: Reported on 03/12/2024), Disp: , Rfl:   Observations/Objective: There were no vitals filed for this visit. Physical Exam Patient is well-developed, well-nourished in no acute distress.  Resting comfortably at home.  Head is normocephalic, atraumatic.  No labored breathing.  Speech is clear and coherent with logical content.  Patient is alert and oriented at baseline.   Assessment and Plan: Acne vulgaris -     Ambulatory referral to Dermatology  Anxiety -     Ambulatory referral to Psychiatry  Attention deficit hyperactivity disorder (ADHD), unspecified ADHD type -     Ambulatory referral to Psychiatry   Dermatology referral for ongoing Accutane management closer to patient's location. Psychiatry referral for evaluation of anxiety, ADHD, and autism spectrum disorder. Patient denies suicidal ideation or thoughts at this time. Continue to monitor mental health symptoms and follow up as needed.  Follow Up Instructions: No follow-ups on file.   I discussed the assessment and treatment plan with the patient. The patient was provided an opportunity to ask questions, and all were answered. The patient agreed with the plan and demonstrated an understanding of the instructions.   The patient was advised to call back or seek an in-person evaluation if the symptoms worsen or if the condition fails to improve as anticipated.  The above assessment and management plan was discussed with the patient. The patient verbalized understanding of and  has agreed to the management plan.   Gilmore Laroche, FNP

## 2024-03-15 NOTE — Patient Instructions (Signed)
 I appreciate the opportunity to provide care to you today!    Follow up:  3 months  Labs: please stop by the lab today to get your blood drawn (CBC, CMP, TSH, Lipid profile, HgA1c, Vit D)  Screening: HIV and Hep C   Please stop by your local pharmacy and get your Tdap and Shingles vaccine  Referrals today-   Attached with your AVS, you will find valuable resources for self-education. I highly recommend dedicating some time to thoroughly examine them.   Please continue to a heart-healthy diet and increase your physical activities. Try to exercise for at least five days a week.    It was a pleasure to see you and I look forward to continuing to work together on your health and well-being. Please do not hesitate to call the office if you need care or have questions about your care.  In case of emergency, please visit the Emergency Department for urgent care, or contact our clinic at [phone] to schedule an appointment. We're here to help you!   Have a wonderful day and week. With Gratitude, Gilmore Laroche MSN, FNP-BC

## 2024-03-26 ENCOUNTER — Ambulatory Visit: Payer: MEDICAID | Admitting: Urology

## 2024-03-26 ENCOUNTER — Other Ambulatory Visit: Payer: MEDICAID

## 2024-03-27 ENCOUNTER — Ambulatory Visit (INDEPENDENT_AMBULATORY_CARE_PROVIDER_SITE_OTHER): Payer: MEDICAID

## 2024-03-27 ENCOUNTER — Other Ambulatory Visit: Payer: Self-pay | Admitting: Obstetrics & Gynecology

## 2024-03-27 DIAGNOSIS — R102 Pelvic and perineal pain: Secondary | ICD-10-CM

## 2024-03-27 NOTE — Progress Notes (Signed)
 T/A PELVIC US: homogeneous anteverted uterus,WNL,EEC 6.7 mm,normal ovaries,no free fluid

## 2024-04-03 ENCOUNTER — Ambulatory Visit: Payer: MEDICAID | Admitting: Family Medicine

## 2024-04-03 VITALS — BP 125/64 | HR 96 | Ht 65.0 in | Wt 156.8 lb

## 2024-04-03 DIAGNOSIS — L658 Other specified nonscarring hair loss: Secondary | ICD-10-CM | POA: Diagnosis not present

## 2024-04-03 DIAGNOSIS — F4323 Adjustment disorder with mixed anxiety and depressed mood: Secondary | ICD-10-CM

## 2024-04-03 DIAGNOSIS — Z789 Other specified health status: Secondary | ICD-10-CM | POA: Diagnosis not present

## 2024-04-03 DIAGNOSIS — Q6239 Other obstructive defects of renal pelvis and ureter: Secondary | ICD-10-CM | POA: Diagnosis not present

## 2024-04-03 MED ORDER — BUPROPION HCL ER (XL) 150 MG PO TB24: 150.0000 mg | ORAL_TABLET | Freq: Every day | ORAL | 1 refills | Status: DC

## 2024-04-03 NOTE — Patient Instructions (Addendum)
 I have booked you an appointment for me on April 30 at 11:30, It is a  double book appt so I will likley not see you until closer to noon.  I have sent in your rx. Please let me know if you have issues.   I am including a list of counselors/psychiatrists in the area so you can check and see if any of them are on your insurance's preferred list/   To see a psychiatry provider, please contact:   Psychiatry Resource List (Adults and Children) Most of these providers will take Medicaid. please consult your insurance for a complete and updated list of available providers. When calling to make an appointment have your insurance information available to confirm you are covered.   [facility]:Psychiatry and Counseling [address] [place] [place], [place] [address] [phone]  [facility]  [address] [place], [place] [phone] Crisis [phone]   [facility]:   [facility]: [address]     [phone]   [facility]: [address] [place]. [place],        [phone] [facility]: [phone],    [phone] [place]: [phone] [address],                   [phone] Children: [facility] [address]         [phone]  [facility] (virtual only) [phone]   [facility]  (Psychiatry only; Adults /children 12 and over, will take Medicaid)  [address] Amy Kansky [address], [place], [place] [address]       [phone]   [facility] (Psychiatry & counseling ; adults & children ; will take Medicaid [address]  [place] [address]  Go on-line to complete referral ( [email or web address] [phone]    (Spanish speaking therapists)  [facility]  Psychiatry & counseling; Adults and children;  Call Registration prior to scheduling an appointment [phone] [address]    [place], [place] [address]    [phone]  [facility] (Psychiatry & counseling; adults & children; Medicare no Medicaid)  [address]   [place], [place]  [address]      [phone]    [facility] (up to age 57)  Psychiatry & counseling ,will take Medicaid, must do counseling to receive psychiatry services  [address] [place] [address]        [phone]     [facility] --- Walk-In Mon-Friday 8am-3pm ( will take Medicaid, Psychiatry, Adults & children,  [address] [place], [place]   [phone]   [facility], Walk-in M-F 8am-12pm and 1pm -3pm   (Counseling, Psychiatry, will take Medicaid, adults & children)  [address] [place], [place]  [phone]

## 2024-04-05 DIAGNOSIS — L658 Other specified nonscarring hair loss: Secondary | ICD-10-CM | POA: Insufficient documentation

## 2024-04-05 MED ORDER — MINOXIDIL 2.5 MG PO TABS: ORAL_TABLET | ORAL | Status: DC

## 2024-04-05 NOTE — Assessment & Plan Note (Signed)
 Agreed to start on Wellbutrin  and he requested specific follow-up in 2 weeks.  I have scheduled that.  He would prefer in person visit.  Also given him some resources for finding a new psychiatrist.  I do think that would be beneficial as his treatment regimen may end up being more complex.  We discussed.

## 2024-04-05 NOTE — Assessment & Plan Note (Signed)
 Happy with current level of transition.  No labs needed today.  Will continue current medication.

## 2024-04-05 NOTE — Assessment & Plan Note (Signed)
 Agreed to increase minoxidil  to 5 mg daily.

## 2024-04-05 NOTE — Progress Notes (Signed)
    CHIEF COMPLAINT / HPI:  1.  Depressive symptoms continue to be problematic for him.  He is not pleased with his current psychiatrist and wants to find a new one.  Has discontinued his medications.  The SSRI seems to make him more lackadaisical than it helped him.  Says he spends much of his time just laying in bed.  Wants to try Wellbutrin  and see her that is a little more stimulating. Does have some daily thoughts of wondering what it would be like if he were not here, but no specific intent or plan.  This has been ongoing for quite a while.  He is getting a little frustrated with his inability to make progress.  Says part of this is related to current living situation with his parents whom he does not think are that supportive.  Wants to know if I have any advice on how to find a new psychiatrist. 2.  Thinks he is having more hair loss and would like to increase the minoxidil . 3.  Bilateral lower wisdom tooth pain.   PERTINENT  PMH / PSH: I have reviewed the patient's medications, allergies, past medical and surgical history, smoking status and updated in the EMR as appropriate.   OBJECTIVE:  BP 125/64   Pulse 96   Ht 5\' 5"  (1.651 m)   Wt 156 lb 12.8 oz (71.1 kg)   SpO2 98%   BMI 26.09 kg/m   GENERAL: Well-developed no acute distress HAIR: No particular area of thinning.  See media tab in the chart for photo of frontal hairline. PSYCH: AxOx4. Good eye contact.. No psychomotor retardation or agitation. Appropriate speech fluency and content. Asks and answers questions appropriately. Mood is congruent.  Oropharynx: No gum disease noted.  No evidence of any abscess around the lower wisdom teeth. Neck: No Lymphadenopathy or Thyromegaly. ASSESSMENT / PLAN:   Adjustment disorder with mixed anxiety and depressed mood Agreed to start on Wellbutrin  and he requested specific follow-up in 2 weeks.  I have scheduled that.  He would prefer in person visit.  Also given him some resources for  finding a new psychiatrist.  I do think that would be beneficial as his treatment regimen may end up being more complex.  We discussed.  Female-to-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 Happy with current level of transition.  No labs needed today.  Will continue current medication.  Other specified nonscarring hair loss Agreed to increase minoxidil  to 5 mg daily.   Violetta Grice MD

## 2024-04-10 ENCOUNTER — Ambulatory Visit (INDEPENDENT_AMBULATORY_CARE_PROVIDER_SITE_OTHER): Payer: MEDICAID | Admitting: Obstetrics & Gynecology

## 2024-04-10 ENCOUNTER — Encounter: Payer: Self-pay | Admitting: Obstetrics & Gynecology

## 2024-04-10 VITALS — BP 148/80 | HR 102 | Ht 65.0 in | Wt 156.4 lb

## 2024-04-10 DIAGNOSIS — N898 Other specified noninflammatory disorders of vagina: Secondary | ICD-10-CM

## 2024-04-10 DIAGNOSIS — Z789 Other specified health status: Secondary | ICD-10-CM | POA: Diagnosis not present

## 2024-04-10 DIAGNOSIS — N9411 Superficial (introital) dyspareunia: Secondary | ICD-10-CM | POA: Diagnosis not present

## 2024-04-10 DIAGNOSIS — R102 Pelvic and perineal pain unspecified side: Secondary | ICD-10-CM

## 2024-04-10 DIAGNOSIS — N942 Vaginismus: Secondary | ICD-10-CM

## 2024-04-10 MED ORDER — IBUPROFEN 600 MG PO TABS: 600.0000 mg | ORAL_TABLET | Freq: Four times a day (QID) | ORAL | 3 refills | Status: AC | PRN

## 2024-04-10 MED ORDER — ESTRADIOL 0.1 MG/GM VA CREA: TOPICAL_CREAM | VAGINAL | 2 refills | Status: AC

## 2024-04-10 NOTE — Progress Notes (Signed)
 GYN VISIT Patient name: Colleen Lopez MRN [id number]  Date of birth: 2006/12/07 Chief Complaint:   Follow-up  History of Present Illness:   Colleen Lopez is a 18 y.o. G0 transgender female being seen today for the following concerns:  Ongoing issues regarding pelvic pain & dyspareunia:  Notes discomfort with vaginal initimacy including orgasm.  +dryness, itching and irritation.  Irregular bleeding also seems to be related   Notes h/o UTI Some constipation, no issues with blood in stool  Pelvic pain- feels like pain can be daily, seems to be spontaneous in nature.  Describes it as a "muscle stretch" that can be awful.  Intensity of pain may vary.  Tough to say if ibuprofen  is helpful with pain.    Reviewed prior ultrasound which showed no acute abnormalities  Colleen Lopez has done some research on his own and desires trial of vaginal estrogen  In the process of looking into bottom surgery [facility]  No LMP recorded. Patient has had an injection.    Review of Systems:   Pertinent items are noted in HPI Denies fever/chills, dizziness, headaches, visual disturbances, fatigue, shortness of breath, chest pain Pertinent History Reviewed:   Past Surgical History:  Procedure Laterality Date   AREOLA/NIPPLE RECONSTRUCTION WITH GRAFT Bilateral 06/09/2023   Procedure: AREOLA/NIPPLE RECONSTRUCTION WITH GRAFT;  Surgeon: Alger Infield, MD;  Location: [facility];  Service: Plastics;  Laterality: Bilateral;   IR NEPHROSTOMY PLACEMENT LEFT  10/05/2023   LESION EXCISION WITH COMPLEX REPAIR Bilateral 01/26/2024   Procedure: SCAR REVISION WITH COMPLEX REPAIR BILATERAL CHEST AND LEFT NIPPLE AREOLA;  Surgeon: Alger Infield, MD;  Location: [facility];  Service: Plastics;  Laterality: Bilateral;   ROBOT ASSISTED PYELOPLASTY Left 11/20/2023   Procedure: XI ROBOTIC ASSISTED PYELOPLASTY with left ureteral stent placement;  Surgeon: Marco Severs, MD;  Location: [facility];   Service: Urology;  Laterality: Left;   septorhinoplasty  07/2020   SIMPLE MASTECTOMY WITH AXILLARY SENTINEL NODE BIOPSY Bilateral 06/09/2023   Procedure: SIMPLE MASTECTOMY;  Surgeon: Alger Infield, MD;  Location: [facility];  Service: Plastics;  Laterality: Bilateral;    Past Medical History:  Diagnosis Date   Abdominal pain    ADHD (attention deficit hyperactivity disorder)    Asthma    Dysautonomia (HCC)    Pervasive developmental disorder    PONV (postoperative nausea and vomiting)    POTS (postural orthostatic tachycardia syndrome)    Vision abnormalities    Vomiting    Reviewed problem list, medications and allergies. Physical Assessment:   Vitals:   04/10/24 1503 04/10/24 1537  BP: (!) 147/83 (!) 148/80  Pulse:  (!) 102  Weight: 156 lb 6.4 oz (70.9 kg)   Height: 5\' 5"  (1.651 m)   Body mass index is 26.03 kg/m.       Physical Examination:   General appearance: alert, well appearing, and in no distress  Psych: mood appropriate, normal affect  Skin: warm & dry   Cardiovascular: normal heart rate noted  Respiratory: normal respiratory effort, no distress  Pelvic: examination not indicated  Extremities: no edema   Chaperone: N/A    Assessment & Plan:  1) Vaginsmus, vaginal dryness, pelvic pain - Plan for trial of vaginal estrogen therapy - Plan to use 0.5 g or pea-sized amount nightly for the next 2 weeks then decrease to twice weekly - Again reviewed my concern regarding vaginismus friend/muscle tension playing a role in his current symptoms.  Recommendation for pelvic floor therapy.  Due to patient's current location this would be [facility] - Recommendation to follow-up at [facility] transgender clinic to ensure that all appropriate resources are being utilized to help address some of his concerns  Meds ordered this encounter  Medications   estradiol  (ESTRACE  VAGINAL) 0.1 MG/GM vaginal cream    Sig: 0.5g (pea-sized amount) twice weekly    Dispense:   42.5 g    Refill:  2   ibuprofen  (ADVIL ) 600 MG tablet    Sig: Take 1 tablet (600 mg total) by mouth every 6 (six) hours as needed for moderate pain (pain score 4-6) or cramping. Take with food    Dispense:  30 tablet    Refill:  3   2) Elevated blood pressure - Patient has upcoming appoint with PCP   No orders of the defined types were placed in this encounter.   Return in about 6 months (around 10/10/2024), or if symptoms worsen or fail to improve, for medication follow up.   Joury Allcorn, DO Attending Obstetrician & Gynecologist, [facility] for [employer], [facility]

## 2024-04-17 ENCOUNTER — Ambulatory Visit: Payer: MEDICAID | Admitting: Family Medicine

## 2024-04-17 VITALS — BP 123/76 | HR 104 | Ht 65.0 in | Wt 156.2 lb

## 2024-04-17 DIAGNOSIS — Z789 Other specified health status: Secondary | ICD-10-CM

## 2024-04-17 DIAGNOSIS — G479 Sleep disorder, unspecified: Secondary | ICD-10-CM | POA: Diagnosis not present

## 2024-04-17 DIAGNOSIS — F4323 Adjustment disorder with mixed anxiety and depressed mood: Secondary | ICD-10-CM

## 2024-04-17 DIAGNOSIS — J3089 Other allergic rhinitis: Secondary | ICD-10-CM | POA: Diagnosis not present

## 2024-04-17 MED ORDER — CETIRIZINE HCL 10 MG PO TABS: 10.0000 mg | ORAL_TABLET | Freq: Every day | ORAL | 11 refills | Status: DC

## 2024-04-17 MED ORDER — MIRTAZAPINE 15 MG PO TABS: 15.0000 mg | ORAL_TABLET | Freq: Every day | ORAL | 1 refills | Status: DC

## 2024-04-17 NOTE — Patient Instructions (Signed)
 Please make an appointment to follow up with me in 4-6 weeks. Please use MyChart or call the office if something comes up in the mean time. Great to see you!

## 2024-04-18 ENCOUNTER — Encounter: Payer: Self-pay | Admitting: *Deleted

## 2024-04-18 NOTE — Assessment & Plan Note (Signed)
 Prescription for antihistamine sent.

## 2024-04-18 NOTE — Progress Notes (Signed)
    CHIEF COMPLAINT / HPI: Follow-up starting Lexapro .  Felt like it did not work for him.  Did not like the way it made him feel.  Stopped it.  Has been able to get an appointment with a new psychiatrist but it is not for a month or so.  Denies suicidal homicidal ideation right now.  Wants to try different medication.  Sleep is one of the main issues for him both initiating and maintaining.   PERTINENT  PMH / PSH: I have reviewed the patient's medications, allergies, past medical and surgical history, smoking status and updated in the EMR as appropriate.   OBJECTIVE:  BP 123/76   Pulse (!) 104   Ht 5\' 5"  (1.651 m)   Wt 156 lb 3.2 oz (70.9 kg)   SpO2 100%   BMI 25.99 kg/m  GENERAL: Well-developed no acute distress PSYCH: AxOx4. Good eye contact.. No psychomotor retardation or agitation. Appropriate speech fluency and content. Asks and answers questions appropriately. Mood is congruent.   ASSESSMENT / PLAN:   Adjustment disorder with mixed anxiety and depressed mood I am glad he has been able to get an appoint with a new psychiatrist as his issues are fairly complex.  We looked back through his medications that he tried in the past.  In 2021 he was on Remeron  for a while did not seem to have any bad side effects.  That may help with his sleep so we will try that but I will have him follow-up for long-term planning with his psychiatrist for these issues.  He is in agreement with that plan  Sleep disturbance Hopefully the Remeron  will also give him some added benefit in initiating sleep.  Female-to-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 Continue current medication regimen.  Perennial allergic rhinitis Prescription for antihistamine sent.   Violetta Grice MD

## 2024-04-18 NOTE — Assessment & Plan Note (Signed)
 Hopefully the Remeron  will also give him some added benefit in initiating sleep.

## 2024-04-18 NOTE — Assessment & Plan Note (Signed)
 I am glad he has been able to get an appoint with a new psychiatrist as his issues are fairly complex.  We looked back through his medications that he tried in the past.  In 2021 he was on Remeron  for a while did not seem to have any bad side effects.  That may help with his sleep so we will try that but I will have him follow-up for long-term planning with his psychiatrist for these issues.  He is in agreement with that plan

## 2024-04-18 NOTE — Assessment & Plan Note (Signed)
 Continue current medication regimen

## 2024-04-25 ENCOUNTER — Telehealth: Payer: MEDICAID | Admitting: Family Medicine

## 2024-05-03 ENCOUNTER — Ambulatory Visit: Payer: MEDICAID | Admitting: Obstetrics & Gynecology

## 2024-05-03 ENCOUNTER — Encounter: Payer: Self-pay | Admitting: Obstetrics & Gynecology

## 2024-05-03 VITALS — BP 132/82 | HR 90 | Ht 65.0 in | Wt 160.0 lb

## 2024-05-03 DIAGNOSIS — N9411 Superficial (introital) dyspareunia: Secondary | ICD-10-CM | POA: Diagnosis not present

## 2024-05-03 DIAGNOSIS — Z789 Other specified health status: Secondary | ICD-10-CM

## 2024-05-03 DIAGNOSIS — N939 Abnormal uterine and vaginal bleeding, unspecified: Secondary | ICD-10-CM

## 2024-05-03 DIAGNOSIS — N898 Other specified noninflammatory disorders of vagina: Secondary | ICD-10-CM | POA: Diagnosis not present

## 2024-05-03 DIAGNOSIS — N942 Vaginismus: Secondary | ICD-10-CM

## 2024-05-03 NOTE — Progress Notes (Signed)
 GYN VISIT Patient name: Colleen Lopez MRN [id number]  Date of birth: 2006/02/25 Chief Complaint:   Follow-up (Vaginal cream)  History of Present Illness:   Colleen Lopez is a 18 y.o. transgender female who presents for follow-up regarding the following:   Vaginal dryness/dyspareunia: When take testosterone  shot- during the peak of week pain is better. Taking vaginal estrogen and does feel that is helping- using daily and seems to note significant improvement in dryness.  Colleen Lopez is now able to exercise without feeling raw.  Also wishes to proceed with referral to physical therapy  AUB: Only occasion bleeding- seems to be improved. No LMP recorded (lmp unknown). Patient has had an injection.    Review of Systems:   Pertinent items are noted in HPI Denies fever/chills, dizziness, headaches, visual disturbances, fatigue, shortness of breath, chest pain, abdominal pain, vomiting Pertinent History Reviewed:   Past Surgical History:  Procedure Laterality Date   AREOLA/NIPPLE RECONSTRUCTION WITH GRAFT Bilateral 06/09/2023   Procedure: AREOLA/NIPPLE RECONSTRUCTION WITH GRAFT;  Surgeon: Alger Infield, MD;  Location: [facility];  Service: Plastics;  Laterality: Bilateral;   IR NEPHROSTOMY PLACEMENT LEFT  10/05/2023   LESION EXCISION WITH COMPLEX REPAIR Bilateral 01/26/2024   Procedure: SCAR REVISION WITH COMPLEX REPAIR BILATERAL CHEST AND LEFT NIPPLE AREOLA;  Surgeon: Alger Infield, MD;  Location: [facility];  Service: Plastics;  Laterality: Bilateral;   ROBOT ASSISTED PYELOPLASTY Left 11/20/2023   Procedure: XI ROBOTIC ASSISTED PYELOPLASTY with left ureteral stent placement;  Surgeon: Marco Severs, MD;  Location: [facility];  Service: Urology;  Laterality: Left;   septorhinoplasty  07/2020   SIMPLE MASTECTOMY WITH AXILLARY SENTINEL NODE BIOPSY Bilateral 06/09/2023   Procedure: SIMPLE MASTECTOMY;  Surgeon: Alger Infield, MD;  Location: [facility];   Service: Plastics;  Laterality: Bilateral;    Past Medical History:  Diagnosis Date   Abdominal pain    ADHD (attention deficit hyperactivity disorder)    Asthma    Dysautonomia (HCC)    Pervasive developmental disorder    PONV (postoperative nausea and vomiting)    POTS (postural orthostatic tachycardia syndrome)    Vision abnormalities    Vomiting    Reviewed problem list, medications and allergies. Physical Assessment:   Vitals:   05/03/24 1050  BP: 132/82  Pulse: 90  Weight: 160 lb (72.6 kg)  Height: 5\' 5"  (1.651 m)  Body mass index is 26.63 kg/m.       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, vaginismus, vaginal dryness, dyspareunia, pelvic pain, AUB - Lab work ordered and message sent to PCP to confirm appropriate labs - Will continue with Estrace , recommended daily and then decrease to every 2 to 3 days as needed - Abnormal bleeding seems to be improved we will continue with Depo every 3 months - Patient to follow-up with PCP regarding potential increase in testosterone , this also will depend on lab work   Orders Placed This Encounter  Procedures   Testosterone , Total, LC/MS/MS   Estradiol    Prolactin   TSH   Follicle stimulating hormone   Luteinizing hormone   Progesterone   Testosterone , F Eqlib+T LC/MS   Ambulatory referral to Physical Therapy    Return in about 3 months (around  08/03/2024) for follow up with Dr. Elivia Robotham.   Richie Bonanno, DO Attending Obstetrician & Gynecologist, [facility] for [employer], [facility]

## 2024-05-08 ENCOUNTER — Encounter: Payer: Self-pay | Admitting: Obstetrics & Gynecology

## 2024-05-14 ENCOUNTER — Telehealth (HOSPITAL_COMMUNITY): Payer: Self-pay

## 2024-05-14 ENCOUNTER — Other Ambulatory Visit: Payer: Self-pay | Admitting: *Deleted

## 2024-05-14 ENCOUNTER — Other Ambulatory Visit: Payer: Self-pay | Admitting: Obstetrics & Gynecology

## 2024-05-14 DIAGNOSIS — E349 Endocrine disorder, unspecified: Secondary | ICD-10-CM

## 2024-05-14 DIAGNOSIS — N939 Abnormal uterine and vaginal bleeding, unspecified: Secondary | ICD-10-CM

## 2024-05-14 DIAGNOSIS — Z789 Other specified health status: Secondary | ICD-10-CM

## 2024-05-14 NOTE — Progress Notes (Signed)
 Additional orders

## 2024-05-14 NOTE — Telephone Encounter (Signed)
 Lvm to confirm 05/16/24 appt by 12pm 05/15/24

## 2024-05-16 ENCOUNTER — Ambulatory Visit (HOSPITAL_COMMUNITY): Payer: MEDICAID | Admitting: Registered Nurse

## 2024-05-16 ENCOUNTER — Encounter (HOSPITAL_COMMUNITY): Payer: Self-pay | Admitting: Registered Nurse

## 2024-05-16 DIAGNOSIS — F333 Major depressive disorder, recurrent, severe with psychotic symptoms: Secondary | ICD-10-CM

## 2024-05-16 DIAGNOSIS — F410 Panic disorder [episodic paroxysmal anxiety] without agoraphobia: Secondary | ICD-10-CM

## 2024-05-16 DIAGNOSIS — F411 Generalized anxiety disorder: Secondary | ICD-10-CM

## 2024-05-16 DIAGNOSIS — F0630 Mood disorder due to known physiological condition, unspecified: Secondary | ICD-10-CM | POA: Diagnosis not present

## 2024-05-16 MED ORDER — PRAZOSIN HCL 1 MG PO CAPS: 1.0000 mg | ORAL_CAPSULE | Freq: Every day | ORAL | 0 refills | Status: DC

## 2024-05-16 MED ORDER — CARIPRAZINE HCL 1.5 MG PO CAPS: 1.5000 mg | ORAL_CAPSULE | Freq: Every day | ORAL | 0 refills | Status: DC
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
## 2024-05-16 NOTE — Patient Instructions (Signed)

## 2024-05-17 ENCOUNTER — Telehealth (HOSPITAL_COMMUNITY): Payer: Self-pay

## 2024-05-17 NOTE — Telephone Encounter (Signed)
 Prior authorization for pt's Vraylar  Cap 1.5 MG approved from 05/16/24 to 05/16/25. PA # [id number]

## 2024-05-20 ENCOUNTER — Encounter: Payer: Self-pay | Admitting: Family Medicine

## 2024-05-20 MED ORDER — MINOXIDIL 2.5 MG PO TABS: ORAL_TABLET | ORAL | 11 refills | Status: DC

## 2024-05-20 MED ORDER — TESTOSTERONE CYPIONATE 200 MG/ML IM SOLN: 50.0000 mg | INTRAMUSCULAR | 5 refills | Status: DC

## 2024-05-20 MED ORDER — CETIRIZINE HCL 10 MG PO TABS: 10.0000 mg | ORAL_TABLET | Freq: Every day | ORAL | 11 refills | Status: DC

## 2024-06-03 ENCOUNTER — Telehealth (INDEPENDENT_AMBULATORY_CARE_PROVIDER_SITE_OTHER): Payer: MEDICAID | Admitting: Registered Nurse

## 2024-06-03 ENCOUNTER — Encounter (HOSPITAL_COMMUNITY): Payer: Self-pay | Admitting: Registered Nurse

## 2024-06-03 DIAGNOSIS — F410 Panic disorder [episodic paroxysmal anxiety] without agoraphobia: Secondary | ICD-10-CM

## 2024-06-03 DIAGNOSIS — F333 Major depressive disorder, recurrent, severe with psychotic symptoms: Secondary | ICD-10-CM

## 2024-06-03 DIAGNOSIS — F411 Generalized anxiety disorder: Secondary | ICD-10-CM | POA: Diagnosis not present

## 2024-06-03 DIAGNOSIS — F0630 Mood disorder due to known physiological condition, unspecified: Secondary | ICD-10-CM

## 2024-06-03 MED ORDER — PRAZOSIN HCL 1 MG PO CAPS: 1.0000 mg | ORAL_CAPSULE | Freq: Every day | ORAL | 0 refills | Status: DC

## 2024-06-03 MED ORDER — CARIPRAZINE HCL 1.5 MG PO CAPS: 1.5000 mg | ORAL_CAPSULE | Freq: Every day | ORAL | 0 refills | Status: DC

## 2024-06-03 NOTE — Progress Notes (Signed)
 [facility] MD/PA/NP OP Progress Note  06/03/2024 11:25 PM Colleen Lopez  MRN:  [id number]  Chief Complaint:  Chief Complaint  Patient presents with   Follow-up    Medication management   HPI: Colleen Lopez 18 y.o. adult female to female transgender who prefers to be called Colleen Lopez (he/him/his) presents today for medication management follow up.  He is seen via virtual video visit by this provider, and chart reviewed on 06/03/24.  His psychiatric history is significant for major depressive disorder with psychosis, mood disorder, general anxiety disorder with panic attacks, ADHD, and PTSD.  His mental health is currently managed with Vraylar  1.5 mg daily and Prazosin  1 mg Q hs.  He reports he never started medication related to after getting it filled his grandfather accidentally threw away when cleaning out the car.  He states that there has ben no changes in depression, anxiety, or PTSD hallucinations  He reports he continues therapy and will start Eye movement desensitization and reprocessing (EMDR) therapy next month.  Today he denies suicidal/self-harm/homicidal ideation, paranoia, and abnormal movements.  He reports eating and sleeping without difficulty at this time.   Recommended the following:  Continue current medication regimen without any changes (Vraylar  1.5 mg daily, Prazosin  1 mg Q hs).  Continue counseling/therapy.  Follow up in 2 weeks.  He voices understanding/ agreement with information/recommendations being given to him today.    Visit Diagnosis:    ICD-10-CM   1. Mood disorder in conditions classified elsewhere  F06.30 cariprazine  (VRAYLAR ) 1.5 MG capsule    2. Generalized anxiety disorder with panic attacks  F41.1 cariprazine  (VRAYLAR ) 1.5 MG capsule   F41.0     3. Major depressive disorder, recurrent episode, severe, with psychosis (HCC)  F33.3 cariprazine  (VRAYLAR ) 1.5 MG capsule   Rule out bipolar affect disorder      Past Psychiatric History: See below in medical  history  Past Medical History:  Past Medical History:  Diagnosis Date   Abdominal pain    ADHD (attention deficit hyperactivity disorder)    Asthma    Dysautonomia (HCC)    Pervasive developmental disorder    PONV (postoperative nausea and vomiting)    POTS (postural orthostatic tachycardia syndrome)    Vision abnormalities    Vomiting     Past Surgical History:  Procedure Laterality Date   AREOLA/NIPPLE RECONSTRUCTION WITH GRAFT Bilateral 06/09/2023   Procedure: AREOLA/NIPPLE RECONSTRUCTION WITH GRAFT;  Surgeon: Alger Infield, MD;  Location: [facility];  Service: Plastics;  Laterality: Bilateral;   IR NEPHROSTOMY PLACEMENT LEFT  10/05/2023   LESION EXCISION WITH COMPLEX REPAIR Bilateral 01/26/2024   Procedure: SCAR REVISION WITH COMPLEX REPAIR BILATERAL CHEST AND LEFT NIPPLE AREOLA;  Surgeon: Alger Infield, MD;  Location: [facility];  Service: Plastics;  Laterality: Bilateral;   ROBOT ASSISTED PYELOPLASTY Left 11/20/2023   Procedure: XI ROBOTIC ASSISTED PYELOPLASTY with left ureteral stent placement;  Surgeon: Marco Severs, MD;  Location: [facility];  Service: Urology;  Laterality: Left;   septorhinoplasty  07/2020   SIMPLE MASTECTOMY WITH AXILLARY SENTINEL NODE BIOPSY Bilateral 06/09/2023   Procedure: SIMPLE MASTECTOMY;  Surgeon: Alger Infield, MD;  Location:  SUR[facility]ERY [facility];  Service: Plastics;  Laterality: Bilateral;    Family Psychiatric History: See below in family history  Family History:  Family History  Problem Relation Age of Onset   Obesity Maternal Grandmother    Diabetes type II Maternal Grandmother    Hypertension Maternal Grandmother    COPD Maternal Grandmother  Hyperlipidemia Maternal Grandmother    Heart disease Maternal Grandmother    Aneurysm Maternal Grandfather    Early death Maternal Grandfather    Bipolar disorder Mother    Drug abuse Mother    Drug abuse Father    Alcohol abuse Father    ADD /  ADHD Brother    Bipolar disorder Paternal Uncle    Migraines Neg Hx     Social History:  Social History   Socioeconomic History   Marital status: Significant Other    Spouse name: Not on file   Number of children: Not on file   Years of education: Not on file   Highest education level: Not on file  Occupational History   Not on file  Tobacco Use   Smoking status: Never    Passive exposure: Yes   Smokeless tobacco: Never  Vaping Use   Vaping status: Never Used  Substance and Sexual Activity   Alcohol use: No   Drug use: Yes    Types: Marijuana    Comment: 3 days ago   Sexual activity: Never  Other Topics Concern   Not on file  Social History Narrative   ** Merged History Encounter **       Lives at home with grandmother and step grandfather, aunt and two half brothers. 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      Lives with Edith Gores and 2 half brothers.    He is going to work towards getting GED   He enjoys playing bass, (Psychiatric nurse - guitar)  drawing, and fantasy world building.       Getting his GED. 24-25 school year   Social Drivers of Corporate investment banker Strain: Not on file  Food Insecurity: No Food Insecurity (11/20/2023)   Hunger Vital Sign    Worried About Running Out of Food in the Last Year: Never true    Ran Out of Food in the Last Year: Never true  Transportation Needs: No Transportation Needs (11/20/2023)   PRAPARE - Administrator, Civil Service (Medical): No    Lack of Transportation (Non-Medical): No  Physical Activity: Not on file  Stress: Not on file  Social Connections: Not on file    Allergies: No Known Allergies  Metabolic Disorder Labs: Lab Results  Component Value Date   HGBA1C 5.3 09/06/2023   Lab Results  Component Value Date   PROLACTIN 8.0 07/13/2022   Lab Results  Component Value Date   CHOL 114 09/06/2023   TRIG 148 (H) 09/06/2023   HDL 29  (L) 09/06/2023   CHOLHDL 3.9 09/06/2023   LDLCALC 59 09/06/2023   LDLCALC 63 04/26/2023   Lab Results  Component Value Date   TSH 1.210 09/06/2023   TSH 1.340 12/22/2022    Current Medications: Current Outpatient Medications  Medication Sig Dispense Refill   cariprazine  (VRAYLAR ) 1.5 MG capsule Take 1 capsule (1.5 mg total) by mouth daily. 30 capsule 0   cetirizine  (ZYRTEC ) 10 MG tablet Take 1 tablet (10 mg total) by mouth daily. 30 tablet 11   estradiol  (ESTRACE  VAGINAL) 0.1 MG/GM vaginal cream 0.5g (pea-sized amount) twice weekly 42.5 g 2   ibuprofen  (ADVIL ) 600 MG tablet Take 1 tablet (600 mg total) by mouth every 6 (six) hours as needed for moderate pain (pain score 4-6) or cramping. Take with food 30 tablet 3   ISOtretinoin  (ACCUTANE ) 40 MG capsule Take  40 mg by mouth 2 (two) times daily. (Patient not taking: Reported on 05/03/2024)     medroxyPROGESTERone  (DEPO-PROVERA ) 150 MG/ML injection Inject 1 mL (150 mg total) into the muscle every 3 (three) months. 1 mL 4   minoxidil  (LONITEN ) 2.5 MG tablet Take two tabs by mouth daily 60 tablet 11   prazosin  (MINIPRESS ) 1 MG capsule Take 1 capsule (1 mg total) by mouth at bedtime. 30 capsule 0   testosterone  cypionate (DEPOTESTOSTERONE CYPIONATE) 200 MG/ML injection Inject 0.25 mLs (50 mg total) into the skin every 7 (seven) days. 4 mL 5   No current facility-administered medications for this visit.     Musculoskeletal: Strength & Muscle Tone: within normal limits Gait & Station: normal Patient leans: N/A  Psychiatric Specialty Exam: Review of Systems  Constitutional:        No other complaints at this time  Psychiatric/Behavioral:  Positive for dysphoric mood and hallucinations. Negative for agitation, sleep disturbance and suicidal ideas. Self-injury: denies at this time.The patient is nervous/anxious.   All other systems reviewed and are negative.   There were no vitals taken for this visit.There is no height or weight on file  to calculate BMI.  General Appearance: Casual  Eye Contact:  Good  Speech:  Clear and Coherent and Normal Rate  Volume:  Normal  Mood:  Anxious  Affect:  Congruent  Thought Process:  Coherent, Goal Directed, and Descriptions of Associations: Intact  Orientation:  Full (Time, Place, and Person)  Thought Content: Logical and Hallucinations: Auditory Visual   Suicidal Thoughts:  No  Homicidal Thoughts:  No  Memory:  Immediate;   Good Recent;   Good Remote;   Good  Judgement:  Intact  Insight:  Present  Psychomotor Activity:  Normal  Concentration:  Concentration: Good and Attention Span: Good  Recall:  Good  Fund of Knowledge: Good  Language: Good  Akathisia:  No  Handed:  Right  AIMS (if indicated): not done  Assets:  Communication Skills Desire for Improvement Housing Leisure Time Resilience Social Support  ADL's:  Intact  Cognition: WNL  Sleep:  Good   Screenings: AIMS    Flowsheet Row Office Visit from 05/16/2024 in [facility] [place]  AIMS Total Score 0   GAD-7    Flowsheet Row Office Visit from 05/16/2024 in [facility] [place] Office Visit from 09/15/2023 in [facility] Office Visit from 04/26/2023 in [facility] Office Visit from 03/08/2023 in [facility] [place] Office Visit from 01/30/2023 in [facility]  Total GAD-7 Score 19 20 20 2 21    PHQ2-9    Flowsheet Row Office Visit from 05/16/2024 in [facility] [place] Office Visit from 04/03/2024 in [facility] [place]. [facility] Office Visit from 01/17/2024 in [facility] [place] [facility]. [facility] Office Visit from 11/08/2023 in [facility] Of [place]. [facility] Office Visit from  09/15/2023 in [facility]  PHQ-2 Total Score 5 6 4 6 4   PHQ-9 Total Score 21 20 21 27 21    Flowsheet Row Office Visit from 05/16/2024 in [facility] [place] Admission (Discharged) from 01/26/2024 in [facility] Admission (Discharged) from 11/20/2023 in [place] [facility]  C-SSRS RISK CATEGORY Moderate Risk No Risk No Risk     Assessment and Plan:  Assessment: Patient seen and examined as noted above. Summary: Today Colleen Lopez appears to be doing fairly well even though he states he has not started his medications.  Reports medications accidentally threw away by accident by his grandfather.  Today he denies suicidal/self-harm/homicidal ideation, paranoia, and fluctuations in mood.    During visit he is dressed appropriate for age and weather.  He is seated comfortably in view of camera with no noted distress.  He is alert/oriented x 4, calm/cooperative and mood is congruent with affect.  He spoke in a clear tone at moderate volume, and normal pace, with good eye contact.  His thought process is coherent, relevant, and there is no indication that he is currently responding to internal/external stimuli or experiencing delusional thought content other than his report of episodes of auditory and visual hallucinations.    1. Mood disorder in conditions classified elsewhere - cariprazine  (VRAYLAR ) 1.5 MG capsule; Take 1 capsule (1.5 mg total) by mouth daily.  Dispense: 30 capsule; Refill: 0  2. Generalized anxiety disorder with panic attacks - cariprazine  (VRAYLAR ) 1.5 MG capsule; Take 1 capsule (1.5 mg total) by mouth daily.  Dispense: 30 capsule; Refill: 0  3. Major depressive disorder, recurrent episode, severe, with psychosis (HCC) Comments: Rule out bipolar affect disorder - cariprazine  (VRAYLAR ) 1.5 MG capsule; Take 1 capsule (1.5 mg total) by mouth daily.  Dispense: 30 capsule; Refill: 0  Plan: Medications: Meds ordered this  encounter  Medications   prazosin  (MINIPRESS ) 1 MG capsule    Sig: Take 1 capsule (1 mg total) by mouth at bedtime.    Dispense:  30 capsule    Refill:  0    Supervising Provider:   Eduard Grad T [[id number]]   cariprazine  (VRAYLAR ) 1.5 MG capsule    Sig: Take 1 capsule (1.5 mg total) by mouth daily.    Dispense:  30 capsule    Refill:  0    Supervising Provider:   Eduard Grad T [[id number]]    Labs:  Not indicated at this time  Other:  Continue counseling/therapy.   GeneSight testing ordered Colleen Lopez is instructed to call 911, [phone], mobile crisis, or present to the nearest emergency room should he experience any suicidal/homicidal ideation, auditory/visual/hallucinations, or detrimental worsening of his mental health condition.   Colleen Lopez participated in the development of this treatment plan and verbalized his understanding/agreement with plan as listed.  Follow Up: Return in 2 weeks for medication management Call in the interim for any side-effects, decompensation, questions, or problems  Collaboration of Care: Collaboration of Care: Medication Management AEB medication assessment and refill and Other GeneSight testing ordered  Patient/Guardian was advised Release of Information must be obtained prior to any record release in order to collaborate their care with an outside provider. Patient/Guardian was advised if they have not already done so to contact the registration department to sign all necessary forms in order for us  to release information regarding their care.   Consent: Patient/Guardian gives verbal consent for treatment and assignment of benefits for services provided during this visit. Patient/Guardian expressed understanding and agreed to proceed.    Colleen Vidas, NP 06/03/2024, 11:25 PM

## 2024-06-03 NOTE — Patient Instructions (Signed)
 The GeneSight Psychotropic test analyzes how your genes may affect your outcomes with medications commonly prescribed to treat depression, anxiety, ADHD, and other mental health conditions. The GeneSight Psychotropic test provides your clinician with information about which medications may require dose adjustments, may be less likely to work for you or may have an increased risk of side effects based on your genetic makeup.     Understanding the GeneSight test algorithm The GeneSight test uses a unique combinatorial algorithm, which evaluates how variations in multiple genes may influence an individual's outcomes with certain medications. The algorithm provides important information regarding how an individual may metabolize and/or respond to certain medications commonly prescribed to treat depression, anxiety, ADHD and other psychiatric conditions. This information supplements a healthcare provider's comprehensive medical evaluation to help personalize treatment plans. The GeneSight Psychotropic test has been evaluated in multiple published, peer-reviewed clinical studies. It is the only neuropsychiatric pharmacogenomic test backed by such extensive research.  The role of genetics in predicting outcomes with medications used for psychiatric conditions Understanding the combinatorial approach of the GeneSight test starts with understanding our basic genetics: Each individual has a unique collection of genes responsible for their various genetic traits. The GeneSight test analyzes genetic variation found in 14 total genes that may impact a patient's outcomes with certain medications. This includes 9 pharmacokinetic genes and 5 pharmacodynamic genes: Pharmacokinetic (PK) genes are involved in how the body metabolizes, or breaks down, a particular medication through specific drug metabolizing enzymes. Pharmacodynamic (PD) genes provide information on how your DNA may impact your likelihood of response  or risk for side effects with some medications. The GeneSight test takes into consideration all clinically relevant genes known to affect metabolism or response to a medication and assigns weights based on how big (or small) of a role it is expected to play in the medication's overall metabolism or mechanism of action.  The data that informs the algorithm Evidence used to build the algorithm comes from comprehensive literature and data reviews. Sources include published scientific literature (collected from PubMed, Embase, Radio broadcast assistant, etc.), approved regulatory drug labels, and review applications (FDA). Animal-derived, preclinical data is not included - only human-based data. The data can be separated into three categories: FDA-reviewed pharmacokinetic (PK) and pharmacodynamic (PD) documents submitted as part of a new drug application (NDA). in-vitro (test tube) and in-vivo (human) data assessing medication pathways that have been published in peer-reviewed journals. This identifies the genes involved in the metabolism and/or the mechanism of action of a medication. in-vitro and in-vivo data evaluating genetic variations and applying expected activity levels (i.e., phenotype expression based on genetic variation) that have either been published in peer-reviewed journals or derived from internal lab data. This identifies to what extent the genetic variation is expected to impact the metabolism and/or the mechanism of action of a medication. The quality of the data is scrutinized, with higher quality evidence having a greater impact in the algorithm. For example, in-vivo data is held in higher regard than in-vitro data. Additionally, data assessed from evidence-grading bodies such as the Pharmacogenomics Knowledgebase (PharmGKB), the Clinical Pharmacogenetics Implementation Consortium Recruitment consultant) and the [place] Pharmacogenetic Working Group [facility]) are also evaluated for inclusion. This ensures that all  sources of clinically actionable gene-drug interactions [facility]) and genotype-phenotype relationships have been considered. It is important to note that our evaluation of the data may lead to different interpretations than what is concluded by individual expert PGx groups, evidence grading bodies, or other PGx companies. The field of pharmacogenomics is  constantly evolving. As the evidence surrounding the impact of established and newly identified genetic variants on medication metabolism and response evolves, so too will the algorithm. The [employer] team continually reviews available data to determine if and how it impacts the GeneSight proprietary algorithm. A full review of the GeneSight test is completed approximately every 18 months to 2 years.    For more information visit the GeneSight website:  https://genesight.com Call 911, [phone], mobile crisis, or present to the nearest emergency room should you experience any suicidal/homicidal ideation, auditory/visual/hallucinations, or detrimental worsening of your mental health.  Mobile Crisis Response Teams Listed by counties in vicinity of [facility] providers [facility] [phone] [facility] [phone] [facility] [phone] [facility] [phone] [place]                * [place] [facility] [phone]                * [employer] [phone]  [facility] [phone] [facility] [employer]  [phone] * [employer] [phone]

## 2024-06-05 ENCOUNTER — Ambulatory Visit: Payer: MEDICAID | Admitting: Family Medicine

## 2024-06-05 ENCOUNTER — Encounter: Payer: Self-pay | Admitting: Family Medicine

## 2024-06-05 VITALS — BP 114/89 | HR 95 | Ht 65.0 in | Wt 166.0 lb

## 2024-06-05 DIAGNOSIS — K582 Mixed irritable bowel syndrome: Secondary | ICD-10-CM

## 2024-06-05 DIAGNOSIS — K30 Functional dyspepsia: Secondary | ICD-10-CM

## 2024-06-05 DIAGNOSIS — L658 Other specified nonscarring hair loss: Secondary | ICD-10-CM | POA: Diagnosis not present

## 2024-06-05 MED ORDER — PANTOPRAZOLE SODIUM 20 MG PO TBEC: DELAYED_RELEASE_TABLET | ORAL | 2 refills | Status: DC

## 2024-06-05 MED ORDER — MINOXIDIL 10 MG PO TABS: ORAL_TABLET | ORAL | 3 refills | Status: AC

## 2024-06-05 MED ORDER — TESTOSTERONE CYPIONATE 200 MG/ML IM SOLN: 50.0000 mg | INTRAMUSCULAR | 5 refills | Status: DC

## 2024-06-05 NOTE — Progress Notes (Signed)
    CHIEF COMPLAINT / HPI: #1.  Having some gastrointestinal bloating, acid reflux over the last several weeks.  Also has fairly longstanding but worsening symptoms of constipation alternating with diarrhea.  Diarrhea usually occurs when he is stressed.  Has been taking some MiraLAX for the last couple of weeks intermittently and that does not seem to be helping much.   PERTINENT  PMH / PSH: I have reviewed the patient's medications, allergies, past medical and surgical history, smoking status and updated in the EMR as appropriate.   OBJECTIVE:  BP 114/89   Pulse 95   Ht 5' 5 (1.651 m)   Wt 166 lb (75.3 kg)   SpO2 98%   BMI 27.62 kg/m  GENERAL: Well-developed no acute distress ABDOMEN: Soft, positive bowel sounds nontender nondistended  ASSESSMENT / PLAN:   Non-ulcer dyspepsia Some symptoms of acid reflux but mostly of bloating.  Will try proton pump inhibitor for 1 week and then he will let me know if symptoms have resolved.  If they have, we can continue that intermittently.  If they do not after 7 days, then I would do a total of 21 days trial and see where we are.  If no improvement, would consider GI referral.  He has never had EGD or colonoscopy.  I would continue fiber daily, may be at half typical dose.  Irritable bowel syndrome with both constipation and diarrhea Discussed that steady dose of fiber is the basis for treatment of this.  Has been using intermittent typical dose so we will move that down to half dose daily and see how we progress.  Other specified nonscarring hair loss We discussed medication.  I had given him 2.5 mg, 2 tabs a day and he would rather have 10 mg tablet and cut that in half.  I have made those adjustments.  So far he is not seeing any improvement   Violetta Grice MD

## 2024-06-05 NOTE — Assessment & Plan Note (Signed)
 Discussed that steady dose of fiber is the basis for treatment of this.  Has been using intermittent typical dose so we will move that down to half dose daily and see how we progress.

## 2024-06-05 NOTE — Assessment & Plan Note (Signed)
 Some symptoms of acid reflux but mostly of bloating.  Will try proton pump inhibitor for 1 week and then he will let me know if symptoms have resolved.  If they have, we can continue that intermittently.  If they do not after 7 days, then I would do a total of 21 days trial and see where we are.  If no improvement, would consider GI referral.  He has never had EGD or colonoscopy.  I would continue fiber daily, may be at half typical dose.

## 2024-06-05 NOTE — Assessment & Plan Note (Signed)
 We discussed medication.  I had given him 2.5 mg, 2 tabs a day and he would rather have 10 mg tablet and cut that in half.  I have made those adjustments.  So far he is not seeing any improvement

## 2024-06-06 NOTE — Progress Notes (Signed)
 [facility] MD/PA/NP OP Progress Note  06/03/2024 8:34 AM Colleen Lopez  MRN:  [id number]  Virtual Visit via Video Note  I connected with Colleen Lopez on 06/06/24 at  5:30 PM EDT by a video enabled telemedicine application and verified that I am speaking with the correct person using two identifiers.  Location: Patient: Home Provider: [facility], [place]   I discussed the limitations of evaluation and management by telemedicine and the availability of in person appointments. The patient expressed understanding and agreed to proceed.   I discussed the assessment and treatment plan with the patient. The patient was provided an opportunity to ask questions and all were answered. The patient agreed with the plan and demonstrated an understanding of the instructions.   The patient was advised to call back or seek an in-person evaluation if the symptoms worsen or if the condition fails to improve as anticipated.  I provided 30 minutes of non-face-to-face time during this encounter.   Humberto Magnus, NP   Chief Complaint:  Chief Complaint  Patient presents with   Follow-up    Medication management   HPI: Colleen Lopez 18 y.o. adult female to female transgender who prefers to be called Colleen Lopez (he/him/his) presents today for medication management follow up.  He is seen via virtual video visit by this provider, and chart reviewed on 06/06/24.  His psychiatric history is significant for major depressive disorder with psychosis, mood disorder, general anxiety disorder with panic attacks, ADHD, and PTSD.  His mental health is currently managed with Vraylar  1.5 mg daily and Prazosin  1 mg Q hs.  He reports he never started medication related to after getting it filled his grandfather accidentally threw away when cleaning out the car.  He states that there has ben no changes in depression, anxiety, or PTSD hallucinations  He reports he continues therapy and will start Eye movement desensitization  and reprocessing (EMDR) therapy next month.  Today he denies suicidal/self-harm/homicidal ideation, paranoia, and abnormal movements.  He reports eating and sleeping without difficulty at this time.   Recommended the following:  Continue current medication regimen without any changes (Vraylar  1.5 mg daily, Prazosin  1 mg Q hs).  Continue counseling/therapy.  Follow up in 2 weeks.  He voices understanding/ agreement with information/recommendations being given to him today.    Visit Diagnosis:    ICD-10-CM   1. Mood disorder in conditions classified elsewhere  F06.30 cariprazine  (VRAYLAR ) 1.5 MG capsule    2. Generalized anxiety disorder with panic attacks  F41.1 cariprazine  (VRAYLAR ) 1.5 MG capsule   F41.0     3. Major depressive disorder, recurrent episode, severe, with psychosis (HCC)  F33.3 cariprazine  (VRAYLAR ) 1.5 MG capsule   Rule out bipolar affect disorder      Past Psychiatric History: See below in medical history  Past Medical History:  Past Medical History:  Diagnosis Date   Abdominal pain    ADHD (attention deficit hyperactivity disorder)    Asthma    Dysautonomia (HCC)    Pervasive developmental disorder    PONV (postoperative nausea and vomiting)    POTS (postural orthostatic tachycardia syndrome)    Vision abnormalities    Vomiting     Past Surgical History:  Procedure Laterality Date   AREOLA/NIPPLE RECONSTRUCTION WITH GRAFT Bilateral 06/09/2023   Procedure: AREOLA/NIPPLE RECONSTRUCTION WITH GRAFT;  Surgeon: Alger Infield, MD;  Location: [facility];  Service: Plastics;  Laterality: Bilateral;   IR NEPHROSTOMY PLACEMENT LEFT  10/05/2023   LESION EXCISION WITH COMPLEX REPAIR  Bilateral 01/26/2024   Procedure: SCAR REVISION WITH COMPLEX REPAIR BILATERAL CHEST AND LEFT NIPPLE AREOLA;  Surgeon: Alger Infield, MD;  Location: [facility];  Service: Plastics;  Laterality: Bilateral;   ROBOT ASSISTED PYELOPLASTY Left 11/20/2023   Procedure:  XI ROBOTIC ASSISTED PYELOPLASTY with left ureteral stent placement;  Surgeon: Marco Severs, MD;  Location: [facility];  Service: Urology;  Laterality: Left;   septorhinoplasty  07/2020   SIMPLE MASTECTOMY WITH AXILLARY SENTINEL NODE BIOPSY Bilateral 06/09/2023   Procedure: SIMPLE MASTECTOMY;  Surgeon: Alger Infield, MD;  Location: [facility];  Service: Plastics;  Laterality: Bilateral;    Family Psychiatric History: See below in family history  Family History:  Family History  Problem Relation Age of Onset   Obesity Maternal Grandmother    Diabetes type II Maternal Grandmother    Hypertension Maternal Grandmother    COPD Maternal Grandmother    Hyperlipidemia Maternal Grandmother    Heart disease Maternal Grandmother    Aneurysm Maternal Grandfather    Early death Maternal Grandfather    Bipolar disorder Mother    Drug abuse Mother    Drug abuse Father    Alcohol abuse Father    ADD / ADHD Brother    Bipolar disorder Paternal Uncle    Migraines Neg Hx     Social History:  Social History   Socioeconomic History   Marital status: Significant Other    Spouse name: Not on file   Number of children: Not on file   Years of education: Not on file   Highest education level: Not on file  Occupational History   Not on file  Tobacco Use   Smoking status: Never    Passive exposure: Yes   Smokeless tobacco: Never  Vaping Use   Vaping status: Never Used  Substance and Sexual Activity   Alcohol use: No   Drug use: Yes    Types: Marijuana    Comment: 3 days ago   Sexual activity: Never  Other Topics Concern   Not on file  Social History Narrative   ** Merged History Encounter **       Lives at home with grandmother and step grandfather, aunt and two half brothers. 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      Lives with Colleen Lopez and 2 half brothers.    He is going to work towards getting GED    He enjoys playing bass, (Psychiatric nurse - guitar)  drawing, and fantasy world building.       Getting his GED. 24-25 school year   Social Drivers of Corporate investment banker Strain: Not on file  Food Insecurity: No Food Insecurity (11/20/2023)   Hunger Vital Sign    Worried About Running Out of Food in the Last Year: Never true    Ran Out of Food in the Last Year: Never true  Transportation Needs: No Transportation Needs (11/20/2023)   PRAPARE - Administrator, Civil Service (Medical): No    Lack of Transportation (Non-Medical): No  Physical Activity: Not on file  Stress: Not on file  Social Connections: Not on file    Allergies: No Known Allergies  Metabolic Disorder Labs: Lab Results  Component Value Date   HGBA1C 5.3 09/06/2023   Lab Results  Component Value Date   PROLACTIN 8.0 07/13/2022   Lab Results  Component Value Date   CHOL 114  09/06/2023   TRIG 148 (H) 09/06/2023   HDL 29 (L) 09/06/2023   CHOLHDL 3.9 09/06/2023   LDLCALC 59 09/06/2023   LDLCALC 63 04/26/2023   Lab Results  Component Value Date   TSH 1.210 09/06/2023   TSH 1.340 12/22/2022    Current Medications: Current Outpatient Medications  Medication Sig Dispense Refill   cariprazine  (VRAYLAR ) 1.5 MG capsule Take 1 capsule (1.5 mg total) by mouth daily. 30 capsule 0   cetirizine  (ZYRTEC ) 10 MG tablet Take 1 tablet (10 mg total) by mouth daily. 30 tablet 11   estradiol  (ESTRACE  VAGINAL) 0.1 MG/GM vaginal cream 0.5g (pea-sized amount) twice weekly 42.5 g 2   ibuprofen  (ADVIL ) 600 MG tablet Take 1 tablet (600 mg total) by mouth every 6 (six) hours as needed for moderate pain (pain score 4-6) or cramping. Take with food 30 tablet 3   medroxyPROGESTERone  (DEPO-PROVERA ) 150 MG/ML injection Inject 1 mL (150 mg total) into the muscle every 3 (three) months. 1 mL 4   minoxidil  (LONITEN ) 10 MG tablet Take one half tab daily as directed 45 tablet 3   pantoprazole (PROTONIX) 20 MG tablet Take one  by mouth daily as needed for severe acid reflux 30 tablet 2   prazosin  (MINIPRESS ) 1 MG capsule Take 1 capsule (1 mg total) by mouth at bedtime. 30 capsule 0   testosterone  cypionate (DEPOTESTOSTERONE CYPIONATE) 200 MG/ML injection Inject 0.25 mLs (50 mg total) into the skin every 7 (seven) days. 4 mL 5   No current facility-administered medications for this visit.     Musculoskeletal: Strength & Muscle Tone: within normal limits Gait & Station: normal Patient leans: N/A  Psychiatric Specialty Exam: Review of Systems  Constitutional:        No other complaints at this time  Psychiatric/Behavioral:  Positive for dysphoric mood and hallucinations. Negative for agitation, sleep disturbance and suicidal ideas. Self-injury: denies at this time.The patient is nervous/anxious.   All other systems reviewed and are negative.   There were no vitals taken for this visit.There is no height or weight on file to calculate BMI.  General Appearance: Casual  Eye Contact:  Good  Speech:  Clear and Coherent and Normal Rate  Volume:  Normal  Mood:  Anxious  Affect:  Congruent  Thought Process:  Coherent, Goal Directed, and Descriptions of Associations: Intact  Orientation:  Full (Time, Place, and Person)  Thought Content: Logical and Hallucinations: Auditory Visual   Suicidal Thoughts:  No  Homicidal Thoughts:  No  Memory:  Immediate;   Good Recent;   Good Remote;   Good  Judgement:  Intact  Insight:  Present  Psychomotor Activity:  Normal  Concentration:  Concentration: Good and Attention Span: Good  Recall:  Good  Fund of Knowledge: Good  Language: Good  Akathisia:  No  Handed:  Right  AIMS (if indicated): not done  Assets:  Communication Skills Desire for Improvement Housing Leisure Time Resilience Social Support  ADL's:  Intact  Cognition: WNL  Sleep:  Good   Screenings: AIMS    Flowsheet Row Office Visit from 05/16/2024 in [facility] at  [place]  AIMS Total Score 0   GAD-7    Flowsheet Row Office Visit from 05/16/2024 in [facility] [place] Office Visit from 09/15/2023 in [facility] Office Visit from 04/26/2023 in [facility] Office Visit from 03/08/2023 in [facility] at [place] Office Visit from 01/30/2023  in [facility] for [facility] at [facility]  Total GAD-7 Score 19 20 20 2 21    PHQ2-9    Flowsheet Row Office Visit from 05/16/2024 in [facility] [place] Office Visit from 04/03/2024 in [facility] Of [place]. [facility] Office Visit from 01/17/2024 in [facility] Of [place]. [facility] Office Visit from 11/08/2023 in [facility] - [facility] Dept Of [place]. [facility] Office Visit from 09/15/2023 in [facility]  PHQ-2 Total Score 5 6 4 6 4   PHQ-9 Total Score 21 20 21 27 21    Flowsheet Row Office Visit from 05/16/2024 in [facility] [place] Admission (Discharged) from 01/26/2024 in [facility] Admission (Discharged) from 11/20/2023 in [facility]  C-SSRS RISK CATEGORY Moderate Risk No Risk No Risk     Assessment and Plan:  Assessment: Patient seen and examined as noted above. Summary: Today Colleen Lopez appears to be doing fairly well even though he states he has not started his medications.  Reports medications accidentally threw away by accident by his grandfather.  Today he denies suicidal/self-harm/homicidal ideation, paranoia, and fluctuations in mood.    During visit he is dressed appropriate for age and weather.  He is seated comfortably in view of camera with no noted distress.  He is alert/oriented x 4, calm/cooperative and mood is congruent with affect.  He spoke in a clear tone at moderate volume,  and normal pace, with good eye contact.  His thought process is coherent, relevant, and there is no indication that he is currently responding to internal/external stimuli or experiencing delusional thought content other than his report of episodes of auditory and visual hallucinations.    1. Mood disorder in conditions classified elsewhere - cariprazine  (VRAYLAR ) 1.5 MG capsule; Take 1 capsule (1.5 mg total) by mouth daily.  Dispense: 30 capsule; Refill: 0  2. Generalized anxiety disorder with panic attacks - cariprazine  (VRAYLAR ) 1.5 MG capsule; Take 1 capsule (1.5 mg total) by mouth daily.  Dispense: 30 capsule; Refill: 0  3. Major depressive disorder, recurrent episode, severe, with psychosis (HCC) Comments: Rule out bipolar affect disorder - cariprazine  (VRAYLAR ) 1.5 MG capsule; Take 1 capsule (1.5 mg total) by mouth daily.  Dispense: 30 capsule; Refill: 0  Plan: Medications: Meds ordered this encounter  Medications   prazosin  (MINIPRESS ) 1 MG capsule    Sig: Take 1 capsule (1 mg total) by mouth at bedtime.    Dispense:  30 capsule    Refill:  0    Supervising Provider:   ARFEEN, SYED T [[id number]]   cariprazine  (VRAYLAR ) 1.5 MG capsule    Sig: Take 1 capsule (1.5 mg total) by mouth daily.    Dispense:  30 capsule    Refill:  0    Supervising Provider:   Eduard Grad T [[id number]]    Labs:  Not indicated at this time  Other:  Continue counseling/therapy.   GeneSight testing ordered Colleen Lopez is instructed to call 911, [phone], mobile crisis, or present to the nearest emergency room should he experience any suicidal/homicidal ideation, auditory/visual/hallucinations, or detrimental worsening of his mental health condition.   Colleen Lopez participated in the development of this treatment plan and verbalized his understanding/agreement with plan as listed.  Follow Up: Return in 2 weeks for medication management Call in the interim for any side-effects, decompensation,  questions, or  problems  Collaboration of Care: Collaboration of Care: Medication Management AEB medication assessment and refill and Other GeneSight testing ordered  Patient/Guardian was advised Release of Information must be obtained prior to any record release in order to collaborate their care with an outside provider. Patient/Guardian was advised if they have not already done so to contact the registration department to sign all necessary forms in order for us  to release information regarding their care.   Consent: Patient/Guardian gives verbal consent for treatment and assignment of benefits for services provided during this visit. Patient/Guardian expressed understanding and agreed to proceed.    Liborio Saccente, NP 06/03/2024 8:34 AM

## 2024-07-09 ENCOUNTER — Other Ambulatory Visit: Payer: Self-pay

## 2024-07-09 ENCOUNTER — Ambulatory Visit: Payer: MEDICAID | Attending: Obstetrics & Gynecology

## 2024-07-09 ENCOUNTER — Ambulatory Visit: Payer: MEDICAID

## 2024-07-09 DIAGNOSIS — R102 Pelvic and perineal pain: Secondary | ICD-10-CM | POA: Diagnosis present

## 2024-07-09 DIAGNOSIS — R279 Unspecified lack of coordination: Secondary | ICD-10-CM | POA: Insufficient documentation

## 2024-07-09 DIAGNOSIS — N942 Vaginismus: Secondary | ICD-10-CM | POA: Diagnosis not present

## 2024-07-09 DIAGNOSIS — M62838 Other muscle spasm: Secondary | ICD-10-CM | POA: Insufficient documentation

## 2024-07-09 DIAGNOSIS — M6281 Muscle weakness (generalized): Secondary | ICD-10-CM | POA: Diagnosis present

## 2024-07-09 DIAGNOSIS — R293 Abnormal posture: Secondary | ICD-10-CM | POA: Insufficient documentation

## 2024-07-09 DIAGNOSIS — N9411 Superficial (introital) dyspareunia: Secondary | ICD-10-CM | POA: Insufficient documentation
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
## 2024-07-12 ENCOUNTER — Ambulatory Visit (INDEPENDENT_AMBULATORY_CARE_PROVIDER_SITE_OTHER): Payer: MEDICAID | Admitting: Urology

## 2024-07-12 ENCOUNTER — Encounter: Payer: Self-pay | Admitting: Urology

## 2024-07-12 VITALS — BP 123/79 | HR 102

## 2024-07-12 DIAGNOSIS — N898 Other specified noninflammatory disorders of vagina: Secondary | ICD-10-CM

## 2024-07-12 DIAGNOSIS — R300 Dysuria: Secondary | ICD-10-CM

## 2024-07-12 DIAGNOSIS — R3915 Urgency of urination: Secondary | ICD-10-CM | POA: Diagnosis not present

## 2024-07-12 DIAGNOSIS — R350 Frequency of micturition: Secondary | ICD-10-CM

## 2024-07-12 DIAGNOSIS — Q6239 Other obstructive defects of renal pelvis and ureter: Secondary | ICD-10-CM

## 2024-07-12 DIAGNOSIS — R399 Unspecified symptoms and signs involving the genitourinary system: Secondary | ICD-10-CM

## 2024-07-12 DIAGNOSIS — R351 Nocturia: Secondary | ICD-10-CM | POA: Diagnosis not present

## 2024-07-12 DIAGNOSIS — N1330 Unspecified hydronephrosis: Secondary | ICD-10-CM

## 2024-07-12 LAB — MICROSCOPIC EXAMINATION: Bacteria, UA: NONE SEEN

## 2024-07-12 LAB — URINALYSIS, ROUTINE W REFLEX MICROSCOPIC
Bilirubin, UA: NEGATIVE
Glucose, UA: NEGATIVE
Ketones, UA: NEGATIVE
Leukocytes,UA: NEGATIVE
Nitrite, UA: NEGATIVE
Protein,UA: NEGATIVE
Specific Gravity, UA: 1.03 (ref 1.005–1.030)
Urobilinogen, Ur: 0.2 mg/dL (ref 0.2–1.0)
pH, UA: 6 (ref 5.0–7.5)

## 2024-07-12 LAB — BLADDER SCAN AMB NON-IMAGING: Scan Result: 0

## 2024-07-12 MED ORDER — PHENAZOPYRIDINE HCL 100 MG PO TABS: 100.0000 mg | ORAL_TABLET | Freq: Three times a day (TID) | ORAL | 0 refills | Status: AC | PRN

## 2024-07-14 LAB — URINE CULTURE

## 2024-07-15 ENCOUNTER — Other Ambulatory Visit: Payer: MEDICAID

## 2024-07-15 ENCOUNTER — Ambulatory Visit: Payer: Self-pay | Admitting: Urology

## 2024-07-15 NOTE — Telephone Encounter (Signed)
 Please reschedule imaging and contact pt w/ details her future appt isnt until 09/10 please have scheduled 2-3 weeks prior to appt

## 2024-07-16 ENCOUNTER — Encounter (HOSPITAL_COMMUNITY): Admission: RE | Admit: 2024-07-16 | Payer: MEDICAID | Source: Ambulatory Visit

## 2024-07-18 ENCOUNTER — Encounter (HOSPITAL_COMMUNITY): Payer: MEDICAID

## 2024-07-18 ENCOUNTER — Other Ambulatory Visit: Payer: Self-pay | Admitting: Medical Genetics

## 2024-07-18 ENCOUNTER — Ambulatory Visit (HOSPITAL_COMMUNITY): Payer: MEDICAID

## 2024-07-22 ENCOUNTER — Other Ambulatory Visit: Payer: MEDICAID

## 2024-07-23 ENCOUNTER — Ambulatory Visit (HOSPITAL_COMMUNITY): Payer: MEDICAID

## 2024-07-23 LAB — BASIC METABOLIC PANEL WITH GFR
BUN/Creatinine Ratio: 9 (ref 9–23)
BUN: 9 mg/dL (ref 6–20)
CO2: 22 mmol/L (ref 20–29)
Calcium: 9.5 mg/dL (ref 8.7–10.2)
Chloride: 103 mmol/L (ref 96–106)
Creatinine, Ser: 1.01 mg/dL — ABNORMAL HIGH (ref 0.57–1.00)
Glucose: 96 mg/dL (ref 70–99)
Potassium: 4.5 mmol/L (ref 3.5–5.2)
Sodium: 139 mmol/L (ref 134–144)
eGFR: 82 mL/min/1.73 (ref >59)

## 2024-07-29 ENCOUNTER — Encounter: Payer: Self-pay | Admitting: Family Medicine

## 2024-07-30 MED ORDER — PANTOPRAZOLE SODIUM 20 MG PO TBEC: DELAYED_RELEASE_TABLET | ORAL | 2 refills | Status: AC

## 2024-07-30 MED ORDER — CETIRIZINE HCL 10 MG PO TABS: 10.0000 mg | ORAL_TABLET | Freq: Every day | ORAL | 11 refills | Status: AC

## 2024-07-30 MED ORDER — TESTOSTERONE CYPIONATE 200 MG/ML IM SOLN: 50.0000 mg | INTRAMUSCULAR | 5 refills | Status: AC

## 2024-08-07 ENCOUNTER — Encounter (HOSPITAL_COMMUNITY): Admission: RE | Admit: 2024-08-07 | Payer: MEDICAID | Source: Ambulatory Visit

## 2024-08-08 ENCOUNTER — Ambulatory Visit: Payer: MEDICAID | Attending: Obstetrics & Gynecology

## 2024-08-08 DIAGNOSIS — R102 Pelvic and perineal pain: Secondary | ICD-10-CM | POA: Diagnosis present

## 2024-08-08 DIAGNOSIS — M62838 Other muscle spasm: Secondary | ICD-10-CM | POA: Diagnosis present

## 2024-08-08 DIAGNOSIS — R293 Abnormal posture: Secondary | ICD-10-CM | POA: Insufficient documentation

## 2024-08-08 DIAGNOSIS — M6281 Muscle weakness (generalized): Secondary | ICD-10-CM | POA: Insufficient documentation

## 2024-08-08 DIAGNOSIS — R279 Unspecified lack of coordination: Secondary | ICD-10-CM | POA: Insufficient documentation
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
## 2024-08-14 ENCOUNTER — Encounter (HOSPITAL_COMMUNITY)
Admission: RE | Admit: 2024-08-14 | Discharge: 2024-08-14 | Disposition: A | Discharge status: Home / Self Care | Payer: MEDICAID | Source: Ambulatory Visit | Attending: Urology | Admitting: Urology

## 2024-08-14 ENCOUNTER — Encounter (HOSPITAL_COMMUNITY): Payer: Self-pay

## 2024-08-14 DIAGNOSIS — N1330 Unspecified hydronephrosis: Secondary | ICD-10-CM | POA: Insufficient documentation

## 2024-08-14 DIAGNOSIS — Q6239 Other obstructive defects of renal pelvis and ureter: Secondary | ICD-10-CM | POA: Diagnosis present

## 2024-08-14 MED ORDER — FUROSEMIDE 10 MG/ML IJ SOLN
INTRAMUSCULAR | Status: AC
Start: 2024-08-14 — End: 2024-08-14
  Filled 2024-08-14: qty 4

## 2024-08-14 MED ORDER — TECHNETIUM TC 99M MERTIATIDE
5.1000 | Freq: Once | INTRAVENOUS | Status: AC | PRN
  Administered 2024-08-14: 5.1 via INTRAVENOUS

## 2024-08-14 MED ORDER — FUROSEMIDE 10 MG/ML IJ SOLN
40.0000 mg | Freq: Once | INTRAMUSCULAR | Status: AC
  Administered 2024-08-14: 40 mg via INTRAVENOUS

## 2024-08-26 ENCOUNTER — Ambulatory Visit: Payer: MEDICAID

## 2024-08-26 ENCOUNTER — Telehealth: Payer: Self-pay | Admitting: Urology

## 2024-08-26 DIAGNOSIS — N1330 Unspecified hydronephrosis: Secondary | ICD-10-CM

## 2024-08-26 NOTE — Telephone Encounter (Signed)
 In ER for kidney stone and would like to talk to a nurse. He is in a lot of pain and has questions.

## 2024-08-26 NOTE — Addendum Note (Signed)
 Addended by: SAMMIE EXIE HERO on: 08/26/2024 02:53 PM   Modules accepted: Orders

## 2024-08-26 NOTE — Telephone Encounter (Signed)
 Patient called with no answer. Message left to return call to office.

## 2024-08-26 NOTE — Telephone Encounter (Signed)
 Pt called stating they are in a lot of pain due to kidney stone and wanted to know if they could do anything before their upcoming appt. Pt was advised to get a KUB before upcoming appt and a message would be sent to provider for pain meds

## 2024-08-28 ENCOUNTER — Ambulatory Visit: Payer: MEDICAID | Admitting: Urology

## 2024-09-02 ENCOUNTER — Ambulatory Visit: Payer: MEDICAID

## 2024-09-02 NOTE — Telephone Encounter (Signed)
 Patient left message that stent came out sooner that it should have

## 2024-09-02 NOTE — Telephone Encounter (Signed)
 Attempted to call pt in regard to previous message left pt did not answer lvm to c/b for f/u details

## 2024-09-03 ENCOUNTER — Ambulatory Visit: Payer: Self-pay

## 2024-09-04 ENCOUNTER — Ambulatory Visit: Payer: MEDICAID | Admitting: Family Medicine

## 2024-09-09 ENCOUNTER — Ambulatory Visit: Payer: MEDICAID | Attending: Obstetrics & Gynecology

## 2024-09-09 DIAGNOSIS — R102 Pelvic and perineal pain: Secondary | ICD-10-CM | POA: Insufficient documentation

## 2024-09-09 DIAGNOSIS — R293 Abnormal posture: Secondary | ICD-10-CM | POA: Insufficient documentation

## 2024-09-09 DIAGNOSIS — M6281 Muscle weakness (generalized): Secondary | ICD-10-CM | POA: Insufficient documentation

## 2024-09-09 DIAGNOSIS — M62838 Other muscle spasm: Secondary | ICD-10-CM | POA: Insufficient documentation

## 2024-09-09 DIAGNOSIS — R279 Unspecified lack of coordination: Secondary | ICD-10-CM | POA: Diagnosis present
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
## 2024-09-16 ENCOUNTER — Ambulatory Visit: Payer: MEDICAID

## 2024-09-16 DIAGNOSIS — M62838 Other muscle spasm: Secondary | ICD-10-CM

## 2024-09-16 DIAGNOSIS — R102 Pelvic and perineal pain: Secondary | ICD-10-CM

## 2024-09-16 DIAGNOSIS — R279 Unspecified lack of coordination: Secondary | ICD-10-CM

## 2024-09-16 DIAGNOSIS — M6281 Muscle weakness (generalized): Secondary | ICD-10-CM

## 2024-09-16 DIAGNOSIS — R293 Abnormal posture: Secondary | ICD-10-CM
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
## 2024-09-23 ENCOUNTER — Ambulatory Visit: Payer: MEDICAID | Attending: Obstetrics & Gynecology

## 2024-09-23 DIAGNOSIS — M6281 Muscle weakness (generalized): Secondary | ICD-10-CM | POA: Diagnosis present

## 2024-09-23 DIAGNOSIS — R279 Unspecified lack of coordination: Secondary | ICD-10-CM | POA: Insufficient documentation

## 2024-09-23 DIAGNOSIS — R102 Pelvic and perineal pain unspecified side: Secondary | ICD-10-CM | POA: Diagnosis present

## 2024-09-23 DIAGNOSIS — M62838 Other muscle spasm: Secondary | ICD-10-CM | POA: Insufficient documentation

## 2024-09-23 DIAGNOSIS — R293 Abnormal posture: Secondary | ICD-10-CM | POA: Insufficient documentation
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
## 2024-09-27 ENCOUNTER — Other Ambulatory Visit: Payer: Self-pay | Admitting: Medical Genetics

## 2024-09-27 DIAGNOSIS — Z006 Encounter for examination for normal comparison and control in clinical research program: Secondary | ICD-10-CM

## 2024-10-02 ENCOUNTER — Ambulatory Visit (INDEPENDENT_AMBULATORY_CARE_PROVIDER_SITE_OTHER): Payer: MEDICAID

## 2024-10-02 DIAGNOSIS — Z23 Encounter for immunization: Secondary | ICD-10-CM

## 2024-10-04 NOTE — Progress Notes (Signed)
 Patient presents to nurse clinic for flu and COVID vaccinations.   Administered vaccinations without complication.   Chiquita JAYSON English, RN

## 2024-10-07 ENCOUNTER — Ambulatory Visit: Payer: MEDICAID

## 2024-10-14 ENCOUNTER — Ambulatory Visit: Payer: MEDICAID

## 2024-10-23 ENCOUNTER — Ambulatory Visit: Payer: MEDICAID | Admitting: Obstetrics & Gynecology

## 2024-10-23 LAB — GENECONNECT MOLECULAR SCREEN: Genetic Analysis Overall Interpretation: NEGATIVE

## 2024-10-30 ENCOUNTER — Ambulatory Visit: Payer: MEDICAID | Admitting: Family Medicine

## 2024-10-30 VITALS — BP 110/58 | HR 96 | Ht 65.0 in | Wt 172.0 lb

## 2024-10-30 DIAGNOSIS — G479 Sleep disorder, unspecified: Secondary | ICD-10-CM

## 2024-10-30 DIAGNOSIS — G4700 Insomnia, unspecified: Secondary | ICD-10-CM | POA: Diagnosis not present

## 2024-10-30 DIAGNOSIS — Z789 Other specified health status: Secondary | ICD-10-CM | POA: Diagnosis not present

## 2024-10-30 DIAGNOSIS — F4323 Adjustment disorder with mixed anxiety and depressed mood: Secondary | ICD-10-CM

## 2024-10-30 MED ORDER — RAMELTEON 8 MG PO TABS: 8.0000 mg | ORAL_TABLET | Freq: Every day | ORAL | 1 refills | Status: AC

## 2024-10-30 NOTE — Patient Instructions (Addendum)
 I am referring you to Dr. [facility]l [facility] [address], [address] [place], [place] [address]   [phone]  Best of luck with your surgery!

## 2024-10-31 ENCOUNTER — Telehealth (HOSPITAL_COMMUNITY): Payer: Self-pay | Admitting: Registered Nurse
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
## 2024-11-04 ENCOUNTER — Telehealth: Payer: Self-pay | Admitting: *Deleted

## 2024-11-04 ENCOUNTER — Other Ambulatory Visit: Payer: Self-pay | Admitting: Obstetrics & Gynecology

## 2024-11-04 MED ORDER — ESTRADIOL 0.01 % VA CREA: TOPICAL_CREAM | VAGINAL | 6 refills | Status: AC

## 2024-11-04 NOTE — Progress Notes (Signed)
 Rx for estrace  cream  Cipriana Biller, DO Attending Obstetrician & Gynecologist, [facility] for [employer], [facility]

## 2024-11-04 NOTE — Telephone Encounter (Signed)
 Pt states that the estrace  cream was supposed to be as needed but on the rx it says twice a week. He needs a new rx sent in so that he can use is as needed and not have trouble picking it up. Advised patient Dr. Ozan not in the office and I will have to send her a message.

## 2024-11-07 ENCOUNTER — Ambulatory Visit: Payer: MEDICAID | Admitting: Dermatology

## 2024-11-07 ENCOUNTER — Ambulatory Visit (INDEPENDENT_AMBULATORY_CARE_PROVIDER_SITE_OTHER): Payer: MEDICAID | Admitting: Physician Assistant

## 2024-11-07 ENCOUNTER — Telehealth (HOSPITAL_COMMUNITY): Payer: Self-pay | Admitting: Registered Nurse

## 2024-11-07 ENCOUNTER — Encounter: Payer: Self-pay | Admitting: Physician Assistant

## 2024-11-07 VITALS — BP 110/67

## 2024-11-07 DIAGNOSIS — L700 Acne vulgaris: Secondary | ICD-10-CM

## 2024-11-07 DIAGNOSIS — L905 Scar conditions and fibrosis of skin: Secondary | ICD-10-CM | POA: Diagnosis not present

## 2024-11-07 DIAGNOSIS — L219 Seborrheic dermatitis, unspecified: Secondary | ICD-10-CM

## 2024-11-07 MED ORDER — KETOCONAZOLE 2 % EX SHAM: 1.0000 | MEDICATED_SHAMPOO | CUTANEOUS | 3 refills | Status: AC

## 2024-11-07 MED ORDER — TRIAMCINOLONE ACETONIDE 0.1 % EX OINT: 1.0000 | TOPICAL_OINTMENT | Freq: Every day | CUTANEOUS | 1 refills | Status: AC | PRN

## 2024-11-07 NOTE — Progress Notes (Signed)
   Follow-Up Visit   Subjective  Colleen Lopez is a 18 y.o. adult NEW PATIENT transgender female who presents for the following: Acne Vulgaris of face, chest, shoulders and back. He was on isotretinoin  at outside facility for about 2-3 months ~ 1 year ago. The travel time was over an hour and therefore he never finished the course. It did help. He washes with Cerave cleanser but is not using anything for acne currently. He would like to restart isotretinoin .He is aware that he will have to go back on birth control and he will reach out to his GYN to restart the DEPO shot. He has a history of depression and he is being seen by a registered therapist and his depression is under control. He will get a letter from his therapist given the okay to restart isotretinoin .  Other concern: He also has eczema/psoriasis of scalp and behind ears. He has used OTC products but no prescription medications.    The following portions of the chart were reviewed this encounter and updated as appropriate: medications, allergies, medical history  Review of Systems:  No other skin or systemic complaints except as noted in HPI or Assessment and Plan.  Objective  Well appearing patient in no apparent distress; mood and affect are within normal limits.  Areas Examined: Face, scalp, ears, chest and back  Relevant exam findings are noted in the Assessment and Plan.   Assessment & Plan   ACNE VULGARIS   Related Procedures Comprehensive metabolic panel Lipid panel hCG, serum, qualitative ACNE VULGARIS/ACNE SCARRING  Exam: Open comedones and inflammatory papules face, chest, back - INADEQUATELY CONTROLLED.   Treatment Plan: Urine pregnancy test performed in office today and was negative.  Patient demonstrates comprehension and confirms he will not get pregnant.  Lot #[id number] Exp 04/02/2026  He will transfer his care in [place] and we will re-register him. We will plan to start isotretinoin  on follow up. He was  given a lab order today and advised to have labs drawn the day before his follow up appointment. He was advised today to send a MyChart once he has transferred care and another message once he has talked with his GYN and started birth control. He also needs letter from his therapist stating his depression is under control.   SEBORRHEIC DERMATITIS - scalp / ears  Exam: Pink patches with greasy scale at scalp and postauricular areas    Seborrheic Dermatitis is a chronic persistent rash characterized by pinkness and scaling most commonly of the mid face but also can occur on the scalp (dandruff), ears; mid chest, mid back and groin.  It tends to be exacerbated by stress and cooler weather.  People who have neurologic disease may experience new onset or exacerbation of existing seborrheic dermatitis.  The condition is not curable but treatable and can be controlled.  Treatment Plan: Ketoconazole  2% shampoo  Wash scalp and ears every other shampoo. Let sit a few minutes then rinse.  TMC 0.1% ointment apply to affected areas twice daily until clear.    Return in about 1 month (around 12/07/2024) for Acne with Erminio - start isotretinoin .  I, Roseline Hutchinson, CMA, am acting as scribe for [employer], PA-C .   Documentation: I have reviewed the above documentation for accuracy and completeness, and I agree with the above.  Nichlos Kunzler K, PA-C

## 2024-11-07 NOTE — Telephone Encounter (Signed)
 10:58am 11/07/24 Spoke with patient again explained that his next visit Shuvon Rankin - supervisor will be monitoring the session, the patient wanted to know if she is in trouble replied No Shuvon Rankin is not in trouble it's just a training  The patient stated that the situation is complicated and just want to see someone that is equipped to handle the issues.  Suggested to the patient [facility] [place]  [phone] - suggested an look at the website.

## 2024-11-07 NOTE — Patient Instructions (Signed)

## 2024-11-07 NOTE — Telephone Encounter (Signed)
 10:45am 11/07/24 Spoke with patient and informed the issue with Shuvon Rankin will be address - the patient stated I don't think she is well equip with the issues of PTSD and transgender identification I informed the pt that I would relate the message to Dr. Curry.

## 2024-11-11 ENCOUNTER — Ambulatory Visit: Payer: MEDICAID | Admitting: Obstetrics & Gynecology

## 2024-11-13 ENCOUNTER — Ambulatory Visit: Payer: MEDICAID | Admitting: Urology

## 2024-11-18 ENCOUNTER — Encounter (HOSPITAL_COMMUNITY): Payer: Self-pay | Admitting: Registered Nurse

## 2024-11-18 ENCOUNTER — Telehealth (INDEPENDENT_AMBULATORY_CARE_PROVIDER_SITE_OTHER): Payer: MEDICAID | Admitting: Registered Nurse

## 2024-11-18 DIAGNOSIS — F411 Generalized anxiety disorder: Secondary | ICD-10-CM

## 2024-11-18 DIAGNOSIS — F0630 Mood disorder due to known physiological condition, unspecified: Secondary | ICD-10-CM | POA: Diagnosis not present

## 2024-11-18 DIAGNOSIS — F333 Major depressive disorder, recurrent, severe with psychotic symptoms: Secondary | ICD-10-CM

## 2024-11-18 DIAGNOSIS — F410 Panic disorder [episodic paroxysmal anxiety] without agoraphobia: Secondary | ICD-10-CM | POA: Diagnosis not present

## 2024-11-18 DIAGNOSIS — G4700 Insomnia, unspecified: Secondary | ICD-10-CM

## 2024-11-18 MED ORDER — CARIPRAZINE HCL 1.5 MG PO CAPS: 1.5000 mg | ORAL_CAPSULE | Freq: Every day | ORAL | 1 refills | Status: AC

## 2024-11-18 MED ORDER — CARIPRAZINE HCL 1.5 MG PO CAPS: 1.5000 mg | ORAL_CAPSULE | Freq: Every day | ORAL | 0 refills | Status: AC

## 2024-11-18 MED ORDER — PRAZOSIN HCL 2 MG PO CAPS: 2.0000 mg | ORAL_CAPSULE | Freq: Every day | ORAL | 1 refills | Status: AC

## 2024-11-18 MED ORDER — DOXEPIN HCL 3 MG PO TABS: 3.0000 mg | ORAL_TABLET | Freq: Every evening | ORAL | 1 refills | Status: AC | PRN

## 2024-11-18 NOTE — Progress Notes (Signed)
 BH MD/PA/NP OP Progress Note  06/03/2024 8:34 AM Colleen Lopez  MRN:  [id number]  Virtual Visit via Video Note  I connected with Colleen Lopez on 11/18/24 at  2:00 PM EST by a video enabled telemedicine application and verified that I am speaking with the correct person using two identifiers.  Location: Patient: Home Provider: [facility], [place]   I discussed the limitations of evaluation and management by telemedicine and the availability of in person appointments. The patient expressed understanding and agreed to proceed.   I discussed the assessment and treatment plan with the patient. The patient was provided an opportunity to ask questions and all were answered. The patient agreed with the plan and demonstrated an understanding of the instructions.   The patient was advised to call back or seek an in-person evaluation if the symptoms worsen or if the condition fails to improve as anticipated.  I provided 30 minutes of non-face-to-face time during this encounter.   Luisa Ruder, NP   Chief Complaint:  Chief Complaint  Patient presents with   Follow-up    Medication management   HPI: Colleen Lopez 18 y.o. adult female to female transgender who prefers to be called Colleen Lopez (he/him/his) presents today for medication management follow up.  He is seen via virtual video visit by this provider, and chart reviewed on 11/18/24.  His psychiatric history is significant for major depressive disorder with psychosis, mood disorder, general anxiety disorder with panic attacks, ADHD, PTSD, and borderline personality disorder.  He states that he continues to have nightmares and he hasn't been able to start the Vraylar  related to having issues with pharmacy stating the medication wasn't sent in.  Reviewed chart and medication was sent to [facility] in [place] and he states that is where it should have went.  Informed would leave samples for him and if he continued to have problems with  pharmacy could send the medication to another pharmacy; agreed and understanding voices.  He also states that he is having problems with falling to sleep and staying asleep.  States he is scheduled for a sleep study.  He denies adverse reaction to Prazosin .     Today he denies suicidal/self-harm/homicidal ideation, paranoia, and abnormal movements.  He reports eating without difficulty at this time.   Recommendations:  Vraylar  1.5 mg daily, increase Prazosin  2 mg Q hs).  Start Doxepin 3 mg daily Discussed the efficacy/side effects of Doxepin and educational information added to AVS.  He voiced agreement and understanding of recommendations.  Visit Diagnosis:    ICD-10-CM   1. Insomnia, unspecified type  G47.00 Doxepin HCl 3 MG TABS    2. Mood disorder in conditions classified elsewhere  F06.30 prazosin  (MINIPRESS ) 2 MG capsule    cariprazine  (VRAYLAR ) 1.5 MG capsule    cariprazine  (VRAYLAR ) 1.5 MG capsule    3. Generalized anxiety disorder with panic attacks  F41.1 cariprazine  (VRAYLAR ) 1.5 MG capsule   F41.0 cariprazine  (VRAYLAR ) 1.5 MG capsule    4. Major depressive disorder, recurrent episode, severe, with psychosis (HCC)  F33.3 cariprazine  (VRAYLAR ) 1.5 MG capsule    cariprazine  (VRAYLAR ) 1.5 MG capsule   Rule out bipolar affect disorder       Past Psychiatric History: See below in medical history  Past Medical History:  Past Medical History:  Diagnosis Date   Abdominal pain    ADHD (attention deficit hyperactivity disorder)    Asthma    Dysautonomia (HCC)    Pervasive developmental disorder    PONV (postoperative  nausea and vomiting)    POTS (postural orthostatic tachycardia syndrome)    Vision abnormalities    Vomiting     Past Surgical History:  Procedure Laterality Date   AREOLA/NIPPLE RECONSTRUCTION WITH GRAFT Bilateral 06/09/2023   Procedure: AREOLA/NIPPLE RECONSTRUCTION WITH GRAFT;  Surgeon: Arelia Filippo, MD;  Location: [facility];  Service:  Plastics;  Laterality: Bilateral;   IR NEPHROSTOMY PLACEMENT LEFT  10/05/2023   LESION EXCISION WITH COMPLEX REPAIR Bilateral 01/26/2024   Procedure: SCAR REVISION WITH COMPLEX REPAIR BILATERAL CHEST AND LEFT NIPPLE AREOLA;  Surgeon: Arelia Filippo, MD;  Location: [facility];  Service: Plastics;  Laterality: Bilateral;   ROBOT ASSISTED PYELOPLASTY Left 11/20/2023   Procedure: XI ROBOTIC ASSISTED PYELOPLASTY with left ureteral stent placement;  Surgeon: Sherrilee Belvie CROME, MD;  Location: [facility];  Service: Urology;  Laterality: Left;   septorhinoplasty  07/2020   SIMPLE MASTECTOMY WITH AXILLARY SENTINEL NODE BIOPSY Bilateral 06/09/2023   Procedure: SIMPLE MASTECTOMY;  Surgeon: Arelia Filippo, MD;  Location: [facility];  Service: Plastics;  Laterality: Bilateral;    Family Psychiatric History: See below in family history  Family History:  Family History  Problem Relation Age of Onset   Obesity Maternal Grandmother    Diabetes type II Maternal Grandmother    Hypertension Maternal Grandmother    COPD Maternal Grandmother    Hyperlipidemia Maternal Grandmother    Heart disease Maternal Grandmother    Aneurysm Maternal Grandfather    Early death Maternal Grandfather    Bipolar disorder Mother    Drug abuse Mother    Drug abuse Father    Alcohol abuse Father    ADD / ADHD Brother    Bipolar disorder Paternal Uncle    Migraines Neg Hx     Social History:  Social History   Socioeconomic History   Marital status: Significant Other    Spouse name: Not on file   Number of children: Not on file   Years of education: Not on file   Highest education level: Not on file  Occupational History   Not on file  Tobacco Use   Smoking status: Never    Passive exposure: Yes   Smokeless tobacco: Never  Vaping Use   Vaping status: Never Used  Substance and Sexual Activity   Alcohol use: No   Drug use: Yes    Types: Marijuana    Comment: 3 days ago   Sexual  activity: Never  Other Topics Concern   Not on file  Social History Narrative   ** Merged History Encounter **       Lives at home with grandmother and step grandfather, aunt and two half brothers. 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      Lives with Priscilla and 2 half brothers.    He is going to work towards getting GED   He enjoys playing bass, (psychiatric nurse - guitar)  drawing, and fantasy world building.       Getting his GED. 24-25 school year   Social Drivers of Corporate Investment Banker Strain: Not on file  Food Insecurity: Low Risk  (10/07/2024)   Received from [facility]   Hunger Vital Sign    Within the past 12 months, you worried that your food would run out before you got money to buy more: Never true    Within the past 12 months, the food you  bought just didn't last and you didn't have money to get more. : Never true  Transportation Needs: No Transportation Needs (10/07/2024)   Received from [facility]    In the past 12 months, has lack of reliable transportation kept you from medical appointments, meetings, work or from getting things needed for daily living? : No  Physical Activity: Not on file  Stress: No Stress Concern Present (08/28/2024)   Received from [facility] - Occupational Stress Questionnaire    Do you feel stress - tense, restless, nervous, or anxious, or unable to sleep at night because your mind is troubled all the time - these days?: Not at all  Social Connections: Not on file    Allergies: No Known Allergies  Metabolic Disorder Labs: Lab Results  Component Value Date   HGBA1C 5.3 09/06/2023   Lab Results  Component Value Date   PROLACTIN 8.0 07/13/2022   Lab Results  Component Value Date   CHOL 114 09/06/2023   TRIG 148 (H) 09/06/2023   HDL 29 (L) 09/06/2023   CHOLHDL 3.9 09/06/2023   LDLCALC 59  09/06/2023   LDLCALC 63 04/26/2023   Lab Results  Component Value Date   TSH 1.210 09/06/2023   TSH 1.340 12/22/2022    Current Medications: Current Outpatient Medications  Medication Sig Dispense Refill   cariprazine  (VRAYLAR ) 1.5 MG capsule Take 1 capsule (1.5 mg total) by mouth daily. 28 capsule 0   Doxepin HCl 3 MG TABS Take 1 tablet (3 mg total) by mouth at bedtime as needed (Sleep). 60 tablet 1   estradiol  (ESTRACE ) 0.01 % CREA vaginal cream Pea sized amount to affected area as needed 42.5 g 6   cariprazine  (VRAYLAR ) 1.5 MG capsule Take 1 capsule (1.5 mg total) by mouth daily. 60 capsule 1   cetirizine  (ZYRTEC ) 10 MG tablet Take 1 tablet (10 mg total) by mouth daily. 30 tablet 11   estradiol  (ESTRACE  VAGINAL) 0.1 MG/GM vaginal cream 0.5g (pea-sized amount) twice weekly 42.5 g 2   ibuprofen  (ADVIL ) 600 MG tablet Take 1 tablet (600 mg total) by mouth every 6 (six) hours as needed for moderate pain (pain score 4-6) or cramping. Take with food 30 tablet 3   ketoconazole  (NIZORAL ) 2 % shampoo Apply 1 Application topically as directed. Wash scalp and ears every other shampoo. Let sit a few minutes then rinse 120 mL 3   medroxyPROGESTERone  (DEPO-PROVERA ) 150 MG/ML injection Inject 1 mL (150 mg total) into the muscle every 3 (three) months. 1 mL 4   minoxidil  (LONITEN ) 10 MG tablet Take one half tab daily as directed 45 tablet 3   pantoprazole  (PROTONIX ) 20 MG tablet Take one by mouth daily as needed for severe acid reflux 30 tablet 2   phenazopyridine  (PYRIDIUM ) 100 MG tablet Take 1 tablet (100 mg total) by mouth 3 (three) times daily as needed for pain. 10 tablet 0   prazosin  (MINIPRESS ) 2 MG capsule Take 1 capsule (2 mg total) by mouth at bedtime. 60 capsule 1   ramelteon  (ROZEREM ) 8 MG tablet Take 1 tablet (8 mg total) by mouth at bedtime. 30 tablet 1   testosterone  cypionate (DEPOTESTOSTERONE CYPIONATE) 200 MG/ML injection Inject 0.25 mLs (50 mg total) into the skin every 7 (seven) days.  4 mL 5   triamcinolone  ointment (KENALOG ) 0.1 % Apply 1 Application topically daily as needed. Apply to affected areas twice daily as needed. 80 g 1   No  current facility-administered medications for this visit.     Musculoskeletal: Strength & Muscle Tone: Unable to assess via virtual visit Gait & Station: Unable to assess via virtual visit Patient leans: N/A  Psychiatric Specialty Exam: Review of Systems  Constitutional:        No other complaints at this time  Psychiatric/Behavioral:  Positive for dysphoric mood. Negative for agitation (Mood instability), hallucinations (Reports some hallucinations proir to falling to sleep or upon waking.), sleep disturbance and suicidal ideas. Self-injury: denies.The patient is nervous/anxious.   All other systems reviewed and are negative.   There were no vitals taken for this visit.There is no height or weight on file to calculate BMI.  General Appearance: Casual  Eye Contact:  Good  Speech:  Clear and Coherent and Normal Rate  Volume:  Normal  Mood:  Anxious  Affect:  Congruent  Thought Process:  Coherent, Goal Directed, and Descriptions of Associations: Intact  Orientation:  Full (Time, Place, and Person)  Thought Content: Logical and Hallucinations: Auditory Visual   Suicidal Thoughts:  No  Homicidal Thoughts:  No  Memory:  Immediate;   Good Recent;   Good Remote;   Good  Judgement:  Intact  Insight:  Present  Psychomotor Activity:  Normal  Concentration:  Concentration: Good and Attention Span: Good  Recall:  Good  Fund of Knowledge: Good  Language: Good  Akathisia:  No  Handed:  Right  AIMS (if indicated): not done  Assets:  Communication Skills Desire for Improvement Housing Leisure Time Resilience Social Support  ADL's:  Intact  Cognition: WNL  Sleep:  Fair   Screenings: Geneticist, Molecular Office Visit from 05/16/2024 in [facility] [place]  AIMS Total Score 0   GAD-7     Flowsheet Row Office Visit from 05/16/2024 in [facility] [place] Office Visit from 09/15/2023 in [facility] Office Visit from 04/26/2023 in [facility] Office Visit from 03/08/2023 in [facility] at [place] Office Visit from 01/30/2023 in [facility]  Total GAD-7 Score 19 20 20 2 21    PHQ2-9    Flowsheet Row Office Visit from 10/30/2024 in [facility] Of [place]. [facility] Office Visit from 06/05/2024 in [facility] - A Dept Of [place]. [facility] Office Visit from 05/16/2024 in [facility] at [facility] Visit from 04/03/2024 in [facility] - A Dept Of [place]. [facility] Office Visit from 01/17/2024 in [facility] - A Dept Of [place]. [facility]  PHQ-2 Total Score 6 2 5 6 4   PHQ-9 Total Score 27 14 21 20 21    Flowsheet Row Office Visit from 05/16/2024 in [facility] [place] Admission (Discharged) from 01/26/2024 in [facility] Admission (Discharged) from 11/20/2023 in [facility]  C-SSRS RISK CATEGORY Moderate Risk No Risk No Risk     Assessment and Plan:  Assessment: Summary: Today Colleen Lopez reported Prazosin  not working to decrease nightmares and hasn't started the Vraylar  related to pharmacy issues.  Reports he is not sleeping well and wants something prescribed for sleep.  States he has asleep study scheduled.  Reports that he has had some hallucinations prior to falling to sleep or upon waking but not during  waking hours.  Denies at this time.  Reports eating without difficulty.  He denies suicidal/self-harm/homicidal ideation, paranoia, and fluctuations in mood.    During visit he is dressed appropriate for age and weather.  He is seated  comfortably in view of camera with no noted distress.  He is alert/oriented x 4, calm/cooperative and mood is congruent with affect.  He spoke in a clear tone at moderate volume, and normal pace, with good eye contact.  His thought process is coherent, relevant, and there is no indication that he is currently responding to internal/external stimuli or experiencing delusional thought content other than his report of episodes of auditory and visual hallucinations.    1. Mood disorder in conditions classified elsewhere - prazosin  (MINIPRESS ) 2 MG capsule; Take 1 capsule (2 mg total) by mouth at bedtime.  Dispense: 60 capsule; Refill: 1 - cariprazine  (VRAYLAR ) 1.5 MG capsule; Take 1 capsule (1.5 mg total) by mouth daily.  Dispense: 60 capsule; Refill: 1 - cariprazine  (VRAYLAR ) 1.5 MG capsule; Take 1 capsule (1.5 mg total) by mouth daily.  Dispense: 28 capsule; Refill: 0  2. Generalized anxiety disorder with panic attacks - cariprazine  (VRAYLAR ) 1.5 MG capsule; Take 1 capsule (1.5 mg total) by mouth daily.  Dispense: 60 capsule; Refill: 1 - cariprazine  (VRAYLAR ) 1.5 MG capsule; Take 1 capsule (1.5 mg total) by mouth daily.  Dispense: 28 capsule; Refill: 0  3. Major depressive disorder, recurrent episode, severe, with psychosis (HCC) Comments: Rule out bipolar affect disorder - cariprazine  (VRAYLAR ) 1.5 MG capsule; Take 1 capsule (1.5 mg total) by mouth daily.  Dispense: 60 capsule; Refill: 1 - cariprazine  (VRAYLAR ) 1.5 MG capsule; Take 1 capsule (1.5 mg total) by mouth daily.  Dispense: 28 capsule; Refill: 0  4. Insomnia, unspecified type (Primary) - Doxepin HCl 3 MG TABS; Take 1 tablet (3 mg total) by mouth at bedtime as needed (Sleep).  Dispense: 60 tablet; Refill: 1   Plan: Medications: Meds ordered this encounter  Medications   prazosin  (MINIPRESS ) 2 MG capsule    Sig: Take 1 capsule (2 mg total) by mouth at bedtime.    Dispense:  60 capsule    Refill:  1    Supervising Provider:   CURRY,  SYED T [[id number]]   cariprazine  (VRAYLAR ) 1.5 MG capsule    Sig: Take 1 capsule (1.5 mg total) by mouth daily.    Dispense:  60 capsule    Refill:  1    Supervising Provider:   ARFEEN, SYED T [[id number]]   Doxepin HCl 3 MG TABS    Sig: Take 1 tablet (3 mg total) by mouth at bedtime as needed (Sleep).    Dispense:  60 tablet    Refill:  1    Supervising Provider:   ARFEEN, SYED T [[id number]]   cariprazine  (VRAYLAR ) 1.5 MG capsule    Sig: Take 1 capsule (1.5 mg total) by mouth daily.    Dispense:  28 capsule    Refill:  0    Supervising Provider:   CURRY PATERSON T [[id number]]    Labs:  Not indicated at this time  Other:  Continue counseling/therapy.   Colleen Lopez is instructed to call [phone], mobile crisis, or present to the nearest emergency room should he experience any suicidal/homicidal ideation, auditory/visual/hallucinations, or detrimental worsening of his mental health condition.   Colleen Lopez participated in the development of this treatment plan and verbalized his understanding/agreement with plan as listed.  Follow Up: Return in 2 months for medication management Call in  the interim for any side-effects, decompensation, questions, or problems  Collaboration of Care: Collaboration of Care: Medication Management AEB Medication assessment, adjustment, refills, samples given and started Doxepin  Patient/Guardian was advised Release of Information must be obtained prior to any record release in order to collaborate their care with an outside provider. Patient/Guardian was advised if they have not already done so to contact the registration department to sign all necessary forms in order for us  to release information regarding their care.   Consent: Patient/Guardian gives verbal consent for treatment and assignment of benefits for services provided during this visit. Patient/Guardian expressed understanding and agreed to proceed.    Vegas Coffin, NP 06/03/2024 8:34 AM

## 2024-11-18 NOTE — Patient Instructions (Signed)

## 2024-11-19 ENCOUNTER — Ambulatory Visit: Payer: MEDICAID | Admitting: Obstetrics & Gynecology

## 2024-11-20 ENCOUNTER — Telehealth (HOSPITAL_COMMUNITY): Payer: Self-pay

## 2024-11-20 NOTE — Telephone Encounter (Signed)
 Prior authorization for pt's Doxepin Tab 3 Mg approved from 11/19/24 to 05/20/25

## 2024-12-02 ENCOUNTER — Encounter: Payer: Self-pay | Admitting: Family Medicine

## 2024-12-10 ENCOUNTER — Ambulatory Visit: Payer: MEDICAID | Admitting: Physician Assistant

## 2024-12-27 ENCOUNTER — Encounter: Payer: Self-pay | Admitting: Family Medicine

## 2024-12-31 ENCOUNTER — Encounter: Payer: Self-pay | Admitting: Family Medicine

## 2025-01-22 ENCOUNTER — Ambulatory Visit: Payer: MEDICAID | Admitting: Physician Assistant

## 2025-01-22 ENCOUNTER — Encounter: Payer: Self-pay | Admitting: Physician Assistant

## 2025-01-22 VITALS — BP 138/68

## 2025-01-22 DIAGNOSIS — L219 Seborrheic dermatitis, unspecified: Secondary | ICD-10-CM

## 2025-01-22 DIAGNOSIS — L700 Acne vulgaris: Secondary | ICD-10-CM

## 2025-01-22 DIAGNOSIS — Z79899 Other long term (current) drug therapy: Secondary | ICD-10-CM

## 2025-01-22 DIAGNOSIS — Z7189 Other specified counseling: Secondary | ICD-10-CM | POA: Diagnosis not present

## 2025-01-22 LAB — COMPREHENSIVE METABOLIC PANEL WITH GFR
ALT: 17 [IU]/L (ref 0–32)
AST: 16 [IU]/L (ref 0–40)
Albumin: 4.8 g/dL (ref 4.0–5.0)
Alkaline Phosphatase: 84 [IU]/L (ref 42–106)
BUN/Creatinine Ratio: 12 (ref 9–23)
BUN: 12 mg/dL (ref 6–20)
Bilirubin Total: 1.1 mg/dL (ref 0.0–1.2)
CO2: 21 mmol/L (ref 20–29)
Calcium: 9.8 mg/dL (ref 8.7–10.2)
Chloride: 103 mmol/L (ref 96–106)
Creatinine, Ser: 1.02 mg/dL — ABNORMAL HIGH (ref 0.57–1.00)
Globulin, Total: 2.6 g/dL (ref 1.5–4.5)
Glucose: 88 mg/dL (ref 70–99)
Potassium: 4.4 mmol/L (ref 3.5–5.2)
Sodium: 141 mmol/L (ref 134–144)
Total Protein: 7.4 g/dL (ref 6.0–8.5)
eGFR: 81 mL/min/{1.73_m2}

## 2025-01-22 LAB — LIPID PANEL
Chol/HDL Ratio: 4.6 ratio — ABNORMAL HIGH (ref 0.0–4.4)
Cholesterol, Total: 158 mg/dL (ref 100–199)
HDL: 34 mg/dL — ABNORMAL LOW
LDL Chol Calc (NIH): 105 mg/dL — ABNORMAL HIGH (ref 0–99)
Triglycerides: 105 mg/dL (ref 0–149)
VLDL Cholesterol Cal: 19 mg/dL (ref 5–40)

## 2025-01-22 LAB — HCG, SERUM, QUALITATIVE: hCG,Beta Subunit,Qual,Serum: NEGATIVE m[IU]/mL

## 2025-01-22 MED ORDER — TRIAMCINOLONE ACETONIDE 0.1 % EX CREA: 1.0000 | TOPICAL_CREAM | Freq: Every day | CUTANEOUS | 1 refills | Status: AC | PRN

## 2025-01-22 MED ORDER — CLOBETASOL PROPIONATE 0.05 % EX SOLN: 1.0000 | Freq: Two times a day (BID) | CUTANEOUS | 3 refills | Status: AC

## 2025-01-22 MED ORDER — ISOTRETINOIN 40 MG PO CAPS: 40.0000 mg | ORAL_CAPSULE | Freq: Every day | ORAL | 0 refills | Status: AC

## 2025-01-22 NOTE — Progress Notes (Signed)
" ° °  Follow-Up Visit   Subjective  Colleen Lopez is a 19 y.o. adult ESTABLISHED transgender female who presents for the following: Last OV 10/20/2024 - Acne follow up - He is here to start isotretinoin  today. He is currently taking the Depo-Provera  shot every 3 weeks. The depo is prescribed by his GYN Jennifer Ozan, DO and is given to him by his mother who is a retired engineer, civil (consulting). He got is labs drawn yesterday and his hCG was negative. He also brought a note from his therapist that states he is okay to start isotretinoin  (scanned into media).   He was also given Ketoconazole  2% shampoo for seb derm at his last appointment but he states that it has not helped his scalp.   The following portions of the chart were reviewed this encounter and updated as appropriate: medications, allergies, medical history  Review of Systems:  No other skin or systemic complaints except as noted in HPI or Assessment and Plan.  Objective  Well appearing patient in no apparent distress; mood and affect are within normal limits.   A focused examination was performed of the following areas: Face, scalp   Relevant exam findings are noted in the Assessment and Plan.    Assessment & Plan   ACNE VULGARIS --FACE, CHEST AND BACK  Exam: Open comedones and inflammatory papules   Treatment Plan: Isotretinoin  Counseling; Review and Contraception Counseling: Reviewed potential side effects of isotretinoin  including xerosis, cheilitis, hepatitis, hyperlipidemia, and severe birth defects if taken by a pregnant woman.  Women on isotretinoin  must be celibate (not having sex) or required to use at least 2 birth control methods to prevent pregnancy (unless patient is a female of non-child bearing potential).  Females of child-bearing potential must have monthly pregnancy tests while on isotretinoin  and report through I-Pledge (FDA monitoring program). Reviewed reports of suicidal ideation in those with a history of depression while  taking isotretinoin  and reports of diagnosis of inflammatory bowl disease (IBD) while taking isotretinoin  as well as the lack of evidence for a causal relationship between isotretinoin , depression and IBD. Patient advised to reach out with any questions or concerns. Patient advised not to share pills or donate blood while on treatment or for one month after completing treatment.  He did bring in a letter from his therapist and she has stated that it is ok for him to start isotretinoin  and she will follow him closely.  He is taking Depo-Provera  injections every 3 weeks - his mother is giving him his injections.  Start Isotretinoin  40 mg 1 capsule daily   SEBORRHEIC DERMATITIS Exam: Pink patches with greasy scale at scalp and postauricular areas.   Treatment Plan: Clobetasol  solution Apply to scalp twice daily as needed  TMC 0.1% cream Apply to affected areas behind ears twice daily as needed.   ACNE VULGARIS   HIGH RISK MEDICATION USE   SEBORRHEIC DERMATITIS    Return in about 30 days (around 02/21/2025) for Isotretinoin .  I, Roseline Hutchinson, CMA, am acting as scribe for [employer], PA-C .   Documentation: I have reviewed the above documentation for accuracy and completeness, and I agree with the above.  Tully Burgo K, PA-C    "

## 2025-01-22 NOTE — Patient Instructions (Signed)
 Acne is Severe; chronic and persistent; not at goal. Patient is on Isotretinoin -  requiring FDA mandated monthly evaluations and laboratory monitoring.  While taking Isotretinoin and for 30 days after you finish the medication, do not get pregnant, do not share pills, do not donate blood.  Generic isotretinoin is best absorbed when taken with a fatty meal. Isotretinoin can make you sensitive to the sun. Daily careful sun protection including sunscreen SPF 30+ when outdoors is recommended.     Important Information  Due to recent changes in healthcare laws, you may see results of your pathology and/or laboratory studies on MyChart before the doctors have had a chance to review them. We understand that in some cases there may be results that are confusing or concerning to you. Please understand that not all results are received at the same time and often the doctors may need to interpret multiple results in order to provide you with the best plan of care or course of treatment. Therefore, we ask that you please give [name] 2 business days to thoroughly review all your results before contacting the office for clarification. Should we see a critical lab result, you will be contacted sooner.   If You Need Anything After Your Visit  If you have any questions or concerns for your doctor, please call our main line at [phone] If no one answers, please leave a voicemail as directed and we will return your call as soon as possible. Messages left after 4 pm will be answered the following business day.   You may also send [name] a message via MyChart. We typically respond to MyChart messages within 1-2 business days.  For prescription refills, please ask your pharmacy to contact our office. Our fax number is [phone].  If you have an urgent issue when the clinic is closed that cannot wait until the next business day, you can page your doctor at the number below.    Please note that while we do our best to  be available for urgent issues outside of office hours, we are not available 24/7.   If you have an urgent issue and are unable to reach [name], you may choose to seek medical care at your doctor's office, retail clinic, urgent care center, or emergency room.  If you have a medical emergency, please immediately call 911 or go to the emergency department. In the event of inclement weather, please call our main line at [phone] for an update on the status of any delays or closures.  Dermatology Medication Tips: Please keep the boxes that topical medications come in in order to help keep track of the instructions about where and how to use these. Pharmacies typically print the medication instructions only on the boxes and not directly on the medication tubes.   If your medication is too expensive, please contact our office at [phone] or send [name] a message through MyChart.   We are unable to tell what your co-pay for medications will be in advance as this is different depending on your insurance coverage. However, we may be able to find a substitute medication at lower cost or fill out paperwork to get insurance to cover a needed medication.   If a prior authorization is required to get your medication covered by your insurance company, please allow [name] 1-2 business days to complete this process.  Drug prices often vary depending on where the prescription is filled and some pharmacies may offer cheaper prices.  The website www.goodrx.com contains  coupons for medications through different pharmacies. The prices here do not account for what the cost may be with help from insurance (it may be cheaper with your insurance), but the website can give you the price if you did not use any insurance.  - You can print the associated coupon and take it with your prescription to the pharmacy.  - You may also stop by our office during regular business hours and pick up a GoodRx coupon card.  - If you need your  prescription sent electronically to a different pharmacy, notify our office through [facility] or by phone at [phone]

## 2025-02-24 ENCOUNTER — Ambulatory Visit: Payer: MEDICAID | Admitting: Physician Assistant

## 2025-05-16 ENCOUNTER — Ambulatory Visit: Payer: MEDICAID | Admitting: Urology
